# Patient Record
Sex: Male | Born: 1980 | Race: White | Hispanic: No | Marital: Single | Smoking: Current every day smoker
Health system: Southern US, Community
[De-identification: ages and names within clinical notes are randomized; demographics above are authoritative.]

## PROBLEM LIST (undated history)

## (undated) DIAGNOSIS — I10 Essential (primary) hypertension: Secondary | ICD-10-CM

## (undated) DIAGNOSIS — R079 Chest pain, unspecified: Secondary | ICD-10-CM

## (undated) DIAGNOSIS — R002 Palpitations: Secondary | ICD-10-CM

## (undated) HISTORY — PX: TONSILLECTOMY: SUR1361

## (undated) HISTORY — DX: Palpitations: R00.2

## (undated) HISTORY — PX: KNEE SURGERY: SHX244

## (undated) HISTORY — DX: Chest pain, unspecified: R07.9

---

## 2020-12-28 ENCOUNTER — Ambulatory Visit
Admission: EM | Admit: 2020-12-28 | Discharge: 2020-12-28 | Disposition: A | Discharge status: Home / Self Care | Payer: BC Managed Care – PPO | Attending: Family Medicine | Admitting: Family Medicine

## 2020-12-28 ENCOUNTER — Other Ambulatory Visit: Payer: Self-pay

## 2020-12-28 ENCOUNTER — Ambulatory Visit (INDEPENDENT_AMBULATORY_CARE_PROVIDER_SITE_OTHER): Payer: BC Managed Care – PPO

## 2020-12-28 ENCOUNTER — Encounter: Payer: Self-pay | Admitting: Emergency Medicine

## 2020-12-28 DIAGNOSIS — R0781 Pleurodynia: Secondary | ICD-10-CM | POA: Diagnosis not present

## 2020-12-28 DIAGNOSIS — S2232XA Fracture of one rib, left side, initial encounter for closed fracture: Secondary | ICD-10-CM

## 2020-12-28 DIAGNOSIS — R030 Elevated blood-pressure reading, without diagnosis of hypertension: Secondary | ICD-10-CM

## 2020-12-28 DIAGNOSIS — W19XXXA Unspecified fall, initial encounter: Secondary | ICD-10-CM | POA: Diagnosis not present

## 2020-12-28 NOTE — ED Triage Notes (Signed)
Pt is present with left side rib pain. Pt states that he fell in the shower a couple weeks ago. Pt states that when he is up and moving he doesn't feel much pain just discomfort only when he is laying down is when he feels left side pain. Pt states that when he does take a deep breath he feels discomfort. Denies any chest pain.

## 2020-12-28 NOTE — ED Provider Notes (Signed)
[facility]    CSN: [id number] Arrival date & time: 12/28/20  1219      History   Chief Complaint Chief Complaint  Patient presents with  . Fall  . Flank Pain    HPI Alan Phelps is a 40 y.o. male.   HPI  Patient presents today with left flank pain.  Patient sustained a fall approximately 2 weeks ago landing on his left side.  He reports since that time he has experienced pain with lying on the left side and also with deep breathing or sneezing.  He is concerned for fracture involving the left rib.  Unrelated patient has elevated blood pressure and reports he is aware that he likely has hypertension.  He does not want to start any medication as he is currently in a progress of stopping smoking along with try to improve lifestyle through exercise.  Endorses a family history both mom and dad have hypertension and one parent also has atrial fibrillation.  Patient is asymptomatic of headache, shortness of breath or chest pain.   History reviewed. No pertinent past medical history.  There are no problems to display for this patient.   Home Medications    Prior to Admission medications   Not on File    Family History No family history on file.  Social History     Allergies   Patient has no known allergies.   Review of Systems Review of Systems Pertinent negatives listed in HPI   Physical Exam Triage Vital Signs ED Triage Vitals  Enc Vitals Group     BP 12/28/20 1253 (!) 165/105     Pulse Rate 12/28/20 1253 67     Resp 12/28/20 1253 18     Temp 12/28/20 1253 97.9 F (36.6 C)     Temp Source 12/28/20 1253 Oral     SpO2 12/28/20 1253 95 %     Weight --      Height --      Head Circumference --      Peak Flow --      Pain Score 12/28/20 1252 7     Pain Loc --      Pain Edu? --      Excl. in GC? --    No data found.  Updated Vital Signs BP (!) 165/105 (BP Location: Left Arm)   Pulse 67   Temp 97.9 F (36.6 C) (Oral)   Resp 18    SpO2 95%   Visual Acuity Right Eye Distance:   Left Eye Distance:   Bilateral Distance:    Right Eye Near:   Left Eye Near:    Bilateral Near:     Physical Exam General appearance: alert, well developed, well nourished, cooperative and in no distress Head: Normocephalic, without obvious abnormality, atraumatic Respiratory: Respirations even and unlabored, normal respiratory rate Heart: rate and rhythm normal. No gallop or murmurs noted on exam  Abdomen: BS +, no distention, no rebound tenderness, or no mass Extremities: Left flank tenderness no obvious deformity Skin: Skin color, texture, turgor normal. No rashes seen  Psych: Appropriate mood and affect. Neurologic: Mental status: Alert, oriented to person, place, and time, thought content appropriate.  UC Treatments / Results  Labs (all labs ordered are listed, but only abnormal results are displayed) Labs Reviewed - No data to display  EKG   Radiology No results found.  Procedures Procedures (including critical care time)  Medications Ordered in UC Medications - No data to display  Initial  Impression / Assessment and Plan / UC Course  I have reviewed the triage vital signs and the nursing notes.  Pertinent labs & imaging results that were available during my care of the patient were reviewed by me and considered in my medical decision making (see chart for details).     Left rib fracture nondisplaced.  Management with Tylenol and ibuprofen at home.  As long as is warm compresses.  Patient also advised he can wear a back brace and/or use kinetic tape for splinting to reduce pain.  Patient advised to resume normal activity however avoid any strenuous activity that precipitates pain as this is a sign of over activity. Discussed elevated blood pressure patient declined any treatment.  Advised of red flags of uncontrolled hypertension and did advise he should follow-up with someone in 3 months if blood pressure remains  elevated. Final Clinical Impressions(s) / UC Diagnoses   Final diagnoses:  Closed fracture of one rib of left side, initial encounter  Elevated blood pressure reading in office without diagnosis of hypertension   Discharge Instructions   None    ED Prescriptions    None     PDMP not reviewed this encounter.   Bing Neighbors, [place] 12/28/20 [phone]

## 2021-05-05 DIAGNOSIS — R079 Chest pain, unspecified: Secondary | ICD-10-CM | POA: Diagnosis not present

## 2021-05-05 DIAGNOSIS — R002 Palpitations: Secondary | ICD-10-CM | POA: Diagnosis not present

## 2021-05-05 DIAGNOSIS — F17200 Nicotine dependence, unspecified, uncomplicated: Secondary | ICD-10-CM | POA: Diagnosis not present

## 2021-05-05 DIAGNOSIS — R0602 Shortness of breath: Secondary | ICD-10-CM | POA: Diagnosis not present

## 2021-05-05 DIAGNOSIS — I10 Essential (primary) hypertension: Secondary | ICD-10-CM | POA: Diagnosis not present

## 2021-05-05 DIAGNOSIS — F17210 Nicotine dependence, cigarettes, uncomplicated: Secondary | ICD-10-CM | POA: Diagnosis not present

## 2021-05-06 ENCOUNTER — Telehealth: Payer: Self-pay | Admitting: *Deleted

## 2021-05-06 NOTE — Telephone Encounter (Signed)
Patient is calling to schedule a hospital follow up appointment. He states he was seen in [facility] and was referred for follow up, but I'm unable to see that. Can someone return this patient's call?

## 2021-06-24 ENCOUNTER — Ambulatory Visit (INDEPENDENT_AMBULATORY_CARE_PROVIDER_SITE_OTHER): Payer: BC Managed Care – PPO

## 2021-06-24 ENCOUNTER — Other Ambulatory Visit: Payer: Self-pay

## 2021-06-24 ENCOUNTER — Ambulatory Visit: Payer: BC Managed Care – PPO | Admitting: Cardiology

## 2021-06-24 VITALS — BP 146/100 | HR 82 | Ht 75.0 in | Wt 253.4 lb

## 2021-06-24 DIAGNOSIS — I10 Essential (primary) hypertension: Secondary | ICD-10-CM | POA: Diagnosis not present

## 2021-06-24 DIAGNOSIS — Z13220 Encounter for screening for lipoid disorders: Secondary | ICD-10-CM

## 2021-06-24 DIAGNOSIS — R0789 Other chest pain: Secondary | ICD-10-CM

## 2021-06-24 DIAGNOSIS — R0600 Dyspnea, unspecified: Secondary | ICD-10-CM

## 2021-06-24 DIAGNOSIS — R002 Palpitations: Secondary | ICD-10-CM

## 2021-06-24 DIAGNOSIS — R0609 Other forms of dyspnea: Secondary | ICD-10-CM

## 2021-06-24 DIAGNOSIS — R079 Chest pain, unspecified: Secondary | ICD-10-CM | POA: Diagnosis not present

## 2021-06-24 MED ORDER — METOPROLOL TARTRATE 100 MG PO TABS
ORAL_TABLET | ORAL | 0 refills | Status: DC
Start: 2021-06-24 — End: 2021-08-10

## 2021-06-24 NOTE — Patient Instructions (Addendum)
Medication Instructions:  Your physician recommends that you continue on your current medications as directed. Please refer to the Current Medication list given to you today.  Please take your blood pressure daily and send a MyChart message in 1 week.  *If you need a refill on your cardiac medications before your next appointment, please call your pharmacy*   Lab Work: Your physician recommends that you return for lab work in:  TODAY: BMET, Mag, Lipids If you have labs (blood work) drawn today and your tests are completely normal, you will receive your results only by: MyChart Message (if you have MyChart) OR A paper copy in the mail If you have any lab test that is abnormal or we need to change your treatment, we will call you to review the results.   Testing/Procedures: Your physician has requested that you have an echocardiogram. Echocardiography is a painless test that uses sound waves to create images of your heart. It provides your doctor with information about the size and shape of your heart and how well your heart's chambers and valves are working. This procedure takes approximately one hour. There are no restrictions for this procedure.  A zio monitor was ordered today. It will remain on for 14 days. You will then return monitor and event diary in provided box. It takes 1-2 weeks for report to be downloaded and returned to [place]. We will call you with the results. If monitor falls off or has orange flashing light, please call Zio for further instructions.   ZIO  WHY IS MY DOCTOR PRESCRIBING ZIO? The Zio system is proven and trusted by physicians to detect and diagnose irregular heart rhythms -- and has been prescribed to hundreds of thousands of patients.  The FDA has cleared the Zio system to monitor for many different kinds of irregular heart rhythms. In a study, physicians were able to reach a diagnosis 90% of the time with the Zio system1.  You can wear the Zio monitor -- a  small, discreet, comfortable patch -- during your normal day-to-day activity, including while you sleep, shower, and exercise, while it records every single heartbeat for analysis.  1Barrett, P., et al. Comparison of 24 Hour Holter Monitoring Versus 14 Day Novel Adhesive Patch Electrocardiographic Monitoring. American Journal of Medicine, 2014.  ZIO VS. HOLTER MONITORING The Zio monitor can be comfortably worn for up to 14 days. Holter monitors can be worn for 24 to 48 hours, limiting the time to record any irregular heart rhythms you may have. Zio is able to capture data for the 51% of patients who have their first symptom-triggered arrhythmia after 48 hours.1  LIVE WITHOUT RESTRICTIONS The Zio ambulatory cardiac monitor is a small, unobtrusive, and water-resistant patch--you might even forget you're wearing it. The Zio monitor records and stores every beat of your heart, whether you're sleeping, working out, or showering. Remove on: Oct 7th 2022    Your cardiac CT will be scheduled at one of the below locations:   [facility] [address] [place], [place] [address] [phone]   If scheduled at [facility], please arrive at the [facility] main entrance (entrance A) of [facility] 30 minutes prior to test start time. Proceed to the [facility] Radiology Department (first floor) to check-in and test prep.  Please follow these instructions carefully (unless otherwise directed):   On the Night Before the Test: Be sure to Drink plenty of water. Do not consume any caffeinated/decaffeinated beverages or chocolate  12 hours prior to your test. Do not take any antihistamines 12 hours prior to your test.   On the Day of the Test: Drink plenty of water until 1 hour prior to the test. Do not eat any food 4 hours prior to the test. You may take your regular medications prior to the test.  Take metoprolol (Lopressor) two hours prior to test. HOLD  Hydrochlorothiazide morning of the test.  After the Test: Drink plenty of water. After receiving IV contrast, you may experience a mild flushed feeling. This is normal. On occasion, you may experience a mild rash up to 24 hours after the test. This is not dangerous. If this occurs, you can take Benadryl 25 mg and increase your fluid intake. If you experience trouble breathing, this can be serious. If it is severe call 911 IMMEDIATELY. If it is mild, please call our office. If you take any of these medications: Glipizide/Metformin, Avandament, Glucavance, please do not take 48 hours after completing test unless otherwise instructed.  Please allow 2-4 weeks for scheduling of routine cardiac CTs. Some insurance companies require a pre-authorization which may delay scheduling of this test.   For non-scheduling related questions, please contact the cardiac imaging nurse navigator should you have any questions/concerns: [facility] [place], Cardiac Imaging Nurse Navigator Larey Brick, Cardiac Imaging Nurse Navigator [facility] Direct Office Dial: [phone]   For scheduling needs, including cancellations and rescheduling, please call [place], [phone].   Follow-Up: At [facility], you and your health needs are our priority.  As part of our continuing mission to provide you with exceptional heart care, we have created designated Provider Care Teams.  These Care Teams include your primary Cardiologist (physician) and Advanced Practice Providers (APPs -  Physician Assistants and Nurse Practitioners) who all work together to provide you with the care you need, when you need it.  We recommend signing up for the patient portal called "MyChart".  Sign up information is provided on this After Visit Summary.  MyChart is used to connect with patients for Virtual Visits (Telemedicine).  Patients are able to view lab/test results, encounter notes, upcoming appointments, etc.   Non-urgent messages can be sent to your provider as well.   To learn more about what you can do with MyChart, go to [email or web address].    Your next appointment:   6 week(s)  The format for your next appointment:   Virtual Visit   Provider:   Thomasene Ripple, DO [address], [place], [place] [address]    Other Instructions Echocardiogram An echocardiogram is a test that uses sound waves (ultrasound) to produce images of the heart. Images from an echocardiogram can provide important information about: Heart size and shape. The size and thickness and movement of your heart's walls. Heart muscle function and strength. Heart valve function or if you have stenosis. Stenosis is when the heart valves are too narrow. If blood is flowing backward through the heart valves (regurgitation). A tumor or infectious growth around the heart valves. Areas of heart muscle that are not working well because of poor blood flow or injury from a heart attack. Aneurysm detection. An aneurysm is a weak or damaged part of an artery wall. The wall bulges out from the normal force of blood pumping through the body. Tell a health care provider about: Any allergies you have. All medicines you are taking, including vitamins, herbs, eye drops, creams, and over-the-counter medicines. Any blood disorders you have. Any surgeries you have  had. Any medical conditions you have. Whether you are pregnant or may be pregnant. What are the risks? Generally, this is a safe test. However, problems may occur, including an allergic reaction to dye (contrast) that may be used during the test. What happens before the test? No specific preparation is needed. You may eat and drink normally. What happens during the test?  You will take off your clothes from the waist up and put on a hospital gown. Electrodes or electrocardiogram (ECG)patches may be placed on your chest. The electrodes or patches are then connected to a  device that monitors your heart rate and rhythm. You will lie down on a table for an ultrasound exam. A gel will be applied to your chest to help sound waves pass through your skin. A handheld device, called a transducer, will be pressed against your chest and moved over your heart. The transducer produces sound waves that travel to your heart and bounce back (or "echo" back) to the transducer. These sound waves will be captured in real-time and changed into images of your heart that can be viewed on a video monitor. The images will be recorded on a computer and reviewed by your health care provider. You may be asked to change positions or hold your breath for a short time. This makes it easier to get different views or better views of your heart. In some cases, you may receive contrast through an IV in one of your veins. This can improve the quality of the pictures from your heart. The procedure may vary among health care providers and hospitals. What can I expect after the test? You may return to your normal, everyday life, including diet, activities, and medicines, unless your health care provider tells you not to do that. Follow these instructions at home: It is up to you to get the results of your test. Ask your health care provider, or the department that is doing the test, when your results will be ready. Keep all follow-up visits. This is important. Summary An echocardiogram is a test that uses sound waves (ultrasound) to produce images of the heart. Images from an echocardiogram can provide important information about the size and shape of your heart, heart muscle function, heart valve function, and other possible heart problems. You do not need to do anything to prepare before this test. You may eat and drink normally. After the echocardiogram is completed, you may return to your normal, everyday life, unless your health care provider tells you not to do that. This information is not  intended to replace advice given to you by your health care provider. Make sure you discuss any questions you have with your health care provider. Document Revised: 05/11/2020 Document Reviewed: 05/11/2020 Elsevier Patient Education  2022 [employer].

## 2021-06-24 NOTE — Progress Notes (Signed)
Cardiology Office Note:    Date:  06/24/2021   ID:  Alan Phelps, DOB 01-Jan-1981, MRN [id number]  PCP:  Kaleen Mask, MD  Cardiologist:  Thomasene Ripple, DO  Electrophysiologist:  None   Referring MD: Kaleen Mask, *   'I was recently diagnosed with hypertension"  History of Present Illness:    Alan Phelps is a 40 y.o. male with a hx of recently diagnosed hypertension however the patient believes that he has had hypertension for many years because to many physicals he has had an elevated blood pressure, hyperlipidemia he had lipid profile done many years ago which was elevated, obesity.  The patient is here today with his wife.  He has multiple complaints.  He has had some chest pain, shortness of breath as well as intermittent palpitations. He described the chest pain as a left-sided dullness which is mostly sometimes tightness and pressure-like sensation.  Is lower left side it comes off and on.  He is not sure how long it lasts.  But is intermittent.  Nothing makes it better or worse and resolves on its own.  He is also have recently been experiencing this on rest.  For the patient the chest tightness is to be an issue.  Intentional shortness of breath is on exertion.  He gets winded mostly on the incline.  He is good on flat surfaces.  In addition there has been palpitations he described as a fluttering in his initially was intense but now it is not as frequent but is still occurring.  He denies any lightheadedness or dizziness.  He is particularly concerned given his family history.   Past Medical History:  Diagnosis Date   Intermittent chest pain    Palpitations     Past Surgical History:  Procedure Laterality Date   KNEE SURGERY     TONSILLECTOMY      Current Medications: Current Meds  Medication Sig   amLODipine (NORVASC) 10 MG tablet Take 10 mg by mouth daily.   hydrochlorothiazide (HYDRODIURIL) 25 MG tablet Take 25 mg by mouth daily.   metoprolol  tartrate (LOPRESSOR) 100 MG tablet Take 2 hours prior to CT     Allergies:   Patient has no known allergies.   Social History   Socioeconomic History   Marital status: Single    Spouse name: Not on file   Number of children: Not on file   Years of education: Not on file   Highest education level: Not on file  Occupational History   Not on file  Tobacco Use   Smoking status: Every Day    Types: Cigarettes   Smokeless tobacco: Never  Substance and Sexual Activity   Alcohol use: Yes    Comment: ocass   Drug use: Never   Sexual activity: Not on file  Other Topics Concern   Not on file  Social History Narrative   Not on file   Social Determinants of Health   Financial Resource Strain: Not on file  Food Insecurity: Not on file  Transportation Needs: Not on file  Physical Activity: Not on file  Stress: Not on file  Social Connections: Not on file     Family History: The patient's family history includes Hypertension in his father.  ROS:   Review of Systems  Constitution: Negative for decreased appetite, fever and weight gain.  HENT: Negative for congestion, ear discharge, hoarse voice and sore throat.   Eyes: Negative for discharge, redness, vision loss in right eye and  visual halos.  Cardiovascular: Negative for chest pain, dyspnea on exertion, leg swelling, orthopnea and palpitations.  Respiratory: Negative for cough, hemoptysis, shortness of breath and snoring.   Endocrine: Negative for heat intolerance and polyphagia.  Hematologic/Lymphatic: Negative for bleeding problem. Does not bruise/bleed easily.  Skin: Negative for flushing, nail changes, rash and suspicious lesions.  Musculoskeletal: Negative for arthritis, joint pain, muscle cramps, myalgias, neck pain and stiffness.  Gastrointestinal: Negative for abdominal pain, bowel incontinence, diarrhea and excessive appetite.  Genitourinary: Negative for decreased libido, genital sores and incomplete emptying.   Neurological: Negative for brief paralysis, focal weakness, headaches and loss of balance.  Psychiatric/Behavioral: Negative for altered mental status, depression and suicidal ideas.  Allergic/Immunologic: Negative for HIV exposure and persistent infections.    EKGs/Labs/Other Studies Reviewed:    The following studies were reviewed today:   EKG:  The ekg ordered today demonstrates   Recent Labs: No results found for requested labs within last 8760 hours.  Recent Lipid Panel No results found for: CHOL, TRIG, HDL, CHOLHDL, VLDL, LDLCALC, LDLDIRECT  Physical Exam:    VS:  BP (!) 146/100   Pulse 82   Ht 6\' 3"  (1.905 m)   Wt 253 lb 6.4 oz (114.9 kg)   SpO2 96%   BMI 31.67 kg/m     Wt Readings from Last 3 Encounters:  06/24/21 253 lb 6.4 oz (114.9 kg)     GEN: Well nourished, well developed in no acute distress HEENT: Normal NECK: No JVD; No carotid bruits LYMPHATICS: No lymphadenopathy CARDIAC: S1S2 noted,RRR, no murmurs, rubs, gallops RESPIRATORY:  Clear to auscultation without rales, wheezing or rhonchi  ABDOMEN: Soft, non-tender, non-distended, +bowel sounds, no guarding. EXTREMITIES: No edema, No cyanosis, no clubbing MUSCULOSKELETAL:  No deformity  SKIN: Warm and dry NEUROLOGIC:  Alert and oriented x 3, non-focal PSYCHIATRIC:  Normal affect, good insight  ASSESSMENT:    1. Palpitations   2. DOE (dyspnea on exertion)   3. Chest pain of uncertain etiology   4. Hypertension, unspecified type   5. Other chest pain   6. Screening for hyperlipidemia    PLAN:     He is hypertensive in the office today.  But he tells me he has not taken his medication he takes amlodipine 10 mg daily and hydrochlorothiazide 25 mg a day.  Personal 06/26/21 believes this will make a difference as I do believe he will need a third agent.  He will take his blood pressure for the next week and send me this information should this be still elevated I plan to add valsartan to his  regimen.  Smoking cessation advised.  The symptoms chest pain is concerning, this patient does have intermediate risk for coronary artery disease and at this time I would like to pursue an ischemic evaluation in this patient.  Shared decision a coronary CTA at this time is appropriate.  I have discussed with the patient about the testing.  The patient has no IV contrast allergy and is agreeable to proceed with this test.  He has had suspected longstanding hypertension, only to get an echocardiogram to assess for any diastolic dysfunction or any significant LVH.  I would like to rule out a cardiovascular etiology of this palpitation, therefore at this time I would like to placed a zio patch for  14  days. In additon a transthoracic echocardiogram will be ordered to assess LV/RV function and any structural abnormalities. Once these testing have been performed amd reviewed further reccomendations will be made.  For now, I do reccomend that the patient goes to the nearest ED if  symptoms recur.  The patient is in agreement with the above plan. The patient left the office in stable condition.  The patient will follow up in   Medication Adjustments/Labs and Tests Ordered: Current medicines are reviewed at length with the patient today.  Concerns regarding medicines are outlined above.  Orders Placed This Encounter  Procedures   CT CORONARY MORPH W/CTA COR W/SCORE W/CA W/CM &/OR WO/CM   Basic Metabolic Panel (BMET)   Magnesium   Lipid panel   LONG TERM MONITOR (3-14 DAYS)   EKG 12-Lead   ECHOCARDIOGRAM COMPLETE   Meds ordered this encounter  Medications   metoprolol tartrate (LOPRESSOR) 100 MG tablet    Sig: Take 2 hours prior to CT    Dispense:  1 tablet    Refill:  0    Patient Instructions  Medication Instructions:  Your physician recommends that you continue on your current medications as directed. Please refer to the Current Medication list given to you today.  Please take your blood  pressure daily and send a MyChart message in 1 week.  *If you need a refill on your cardiac medications before your next appointment, please call your pharmacy*   Lab Work: Your physician recommends that you return for lab work in:  TODAY: BMET, Mag, Lipids If you have labs (blood work) drawn today and your tests are completely normal, you will receive your results only by: MyChart Message (if you have MyChart) OR A paper copy in the mail If you have any lab test that is abnormal or we need to change your treatment, we will call you to review the results.   Testing/Procedures: Your physician has requested that you have an echocardiogram. Echocardiography is a painless test that uses sound waves to create images of your heart. It provides your doctor with information about the size and shape of your heart and how well your heart's chambers and valves are working. This procedure takes approximately one hour. There are no restrictions for this procedure.  A zio monitor was ordered today. It will remain on for 14 days. You will then return monitor and event diary in provided box. It takes 1-2 weeks for report to be downloaded and returned to [place]. We will call you with the results. If monitor falls off or has orange flashing light, please call Zio for further instructions.   ZIO  WHY IS MY DOCTOR PRESCRIBING ZIO? The Zio system is proven and trusted by physicians to detect and diagnose irregular heart rhythms -- and has been prescribed to hundreds of thousands of patients.  The FDA has cleared the Zio system to monitor for many different kinds of irregular heart rhythms. In a study, physicians were able to reach a diagnosis 90% of the time with the Zio system1.  You can wear the Zio monitor -- a small, discreet, comfortable patch -- during your normal day-to-day activity, including while you sleep, shower, and exercise, while it records every single heartbeat for analysis.  1Barrett, P., et al.  Comparison of 24 Hour Holter Monitoring Versus 14 Day Novel Adhesive Patch Electrocardiographic Monitoring. American Journal of Medicine, 2014.  ZIO VS. HOLTER MONITORING The Zio monitor can be comfortably worn for up to 14 days. Holter monitors can be worn for 24 to 48 hours, limiting the time to record any irregular heart rhythms you may have. Zio is able to capture data for the 51% of  patients who have their first symptom-triggered arrhythmia after 48 hours.1  LIVE WITHOUT RESTRICTIONS The Zio ambulatory cardiac monitor is a small, unobtrusive, and water-resistant patch--you might even forget you're wearing it. The Zio monitor records and stores every beat of your heart, whether you're sleeping, working out, or showering. Remove on: Oct 7th 2022    Your cardiac CT will be scheduled at one of the below locations:   [facility] [address] [place], [place] [address] [phone]   If scheduled at [facility], please arrive at the [facility] main entrance (entrance A) of [facility] 30 minutes prior to test start time. Proceed to the [facility] Radiology Department (first floor) to check-in and test prep.  Please follow these instructions carefully (unless otherwise directed):   On the Night Before the Test: Be sure to Drink plenty of water. Do not consume any caffeinated/decaffeinated beverages or chocolate 12 hours prior to your test. Do not take any antihistamines 12 hours prior to your test.   On the Day of the Test: Drink plenty of water until 1 hour prior to the test. Do not eat any food 4 hours prior to the test. You may take your regular medications prior to the test.  Take metoprolol (Lopressor) two hours prior to test. HOLD Hydrochlorothiazide morning of the test.  After the Test: Drink plenty of water. After receiving IV contrast, you may experience a mild flushed feeling. This is normal. On occasion, you may experience a mild rash  up to 24 hours after the test. This is not dangerous. If this occurs, you can take Benadryl 25 mg and increase your fluid intake. If you experience trouble breathing, this can be serious. If it is severe call 911 IMMEDIATELY. If it is mild, please call our office. If you take any of these medications: Glipizide/Metformin, Avandament, Glucavance, please do not take 48 hours after completing test unless otherwise instructed.  Please allow 2-4 weeks for scheduling of routine cardiac CTs. Some insurance companies require a pre-authorization which may delay scheduling of this test.   For non-scheduling related questions, please contact the cardiac imaging nurse navigator should you have any questions/concerns: Rockwell Alexandria, Cardiac Imaging Nurse Navigator Larey Brick, Cardiac Imaging Nurse Navigator [facility] Direct Office Dial: [phone]   For scheduling needs, including cancellations and rescheduling, please call [place], [phone].   Follow-Up: At [facility], you and your health needs are our priority.  As part of our continuing mission to provide you with exceptional heart care, we have created designated Provider Care Teams.  These Care Teams include your primary Cardiologist (physician) and Advanced Practice Providers (APPs -  Physician Assistants and Nurse Practitioners) who all work together to provide you with the care you need, when you need it.  We recommend signing up for the patient portal called "MyChart".  Sign up information is provided on this After Visit Summary.  MyChart is used to connect with patients for Virtual Visits (Telemedicine).  Patients are able to view lab/test results, encounter notes, upcoming appointments, etc.  Non-urgent messages can be sent to your provider as well.   To learn more about what you can do with MyChart, go to [email or web address].    Your next appointment:   6 week(s)  The format for your next  appointment:   Virtual Visit   Provider:   Thomasene Ripple, DO [address], [place], [place] [address]    Other Instructions Echocardiogram An echocardiogram  is a test that uses sound waves (ultrasound) to produce images of the heart. Images from an echocardiogram can provide important information about: Heart size and shape. The size and thickness and movement of your heart's walls. Heart muscle function and strength. Heart valve function or if you have stenosis. Stenosis is when the heart valves are too narrow. If blood is flowing backward through the heart valves (regurgitation). A tumor or infectious growth around the heart valves. Areas of heart muscle that are not working well because of poor blood flow or injury from a heart attack. Aneurysm detection. An aneurysm is a weak or damaged part of an artery wall. The wall bulges out from the normal force of blood pumping through the body. Tell a health care provider about: Any allergies you have. All medicines you are taking, including vitamins, herbs, eye drops, creams, and over-the-counter medicines. Any blood disorders you have. Any surgeries you have had. Any medical conditions you have. Whether you are pregnant or may be pregnant. What are the risks? Generally, this is a safe test. However, problems may occur, including an allergic reaction to dye (contrast) that may be used during the test. What happens before the test? No specific preparation is needed. You may eat and drink normally. What happens during the test?  You will take off your clothes from the waist up and put on a hospital gown. Electrodes or electrocardiogram (ECG)patches may be placed on your chest. The electrodes or patches are then connected to a device that monitors your heart rate and rhythm. You will lie down on a table for an ultrasound exam. A gel will be applied to your chest to help sound waves pass through your skin. A handheld device, called a  transducer, will be pressed against your chest and moved over your heart. The transducer produces sound waves that travel to your heart and bounce back (or "echo" back) to the transducer. These sound waves will be captured in real-time and changed into images of your heart that can be viewed on a video monitor. The images will be recorded on a computer and reviewed by your health care provider. You may be asked to change positions or hold your breath for a short time. This makes it easier to get different views or better views of your heart. In some cases, you may receive contrast through an IV in one of your veins. This can improve the quality of the pictures from your heart. The procedure may vary among health care providers and hospitals. What can I expect after the test? You may return to your normal, everyday life, including diet, activities, and medicines, unless your health care provider tells you not to do that. Follow these instructions at home: It is up to you to get the results of your test. Ask your health care provider, or the department that is doing the test, when your results will be ready. Keep all follow-up visits. This is important. Summary An echocardiogram is a test that uses sound waves (ultrasound) to produce images of the heart. Images from an echocardiogram can provide important information about the size and shape of your heart, heart muscle function, heart valve function, and other possible heart problems. You do not need to do anything to prepare before this test. You may eat and drink normally. After the echocardiogram is completed, you may return to your normal, everyday life, unless your health care provider tells you not to do that. This information is not intended to replace  advice given to you by your health care provider. Make sure you discuss any questions you have with your health care provider. Document Revised: 05/11/2020 Document Reviewed: 05/11/2020 Elsevier  Patient Education  2022 Elsevier Inc.     Adopting a Healthy Lifestyle.  Know what a healthy weight is for you (roughly BMI <25) and aim to maintain this   Aim for 7+ servings of fruits and vegetables daily   65-80+ fluid ounces of water or unsweet tea for healthy kidneys   Limit to max 1 drink of alcohol per day; avoid smoking/tobacco   Limit animal fats in diet for cholesterol and heart health - choose grass fed whenever available   Avoid highly processed foods, and foods high in saturated/trans fats   Aim for low stress - take time to unwind and care for your mental health   Aim for 150 min of moderate intensity exercise weekly for heart health, and weights twice weekly for bone health   Aim for 7-9 hours of sleep daily   When it comes to diets, agreement about the perfect plan isnt easy to find, even among the experts. Experts at the [facility] [employer] [facility] [employer] developed an idea known as the Healthy Eating Plate. Just imagine a plate divided into logical, healthy portions.   The emphasis is on diet quality:   Load up on vegetables and fruits - one-half of your plate: Aim for color and variety, and remember that potatoes dont count.   Go for whole grains - one-quarter of your plate: Whole wheat, barley, wheat berries, quinoa, oats, brown rice, and foods made with them. If you want pasta, go with whole wheat pasta.   Protein power - one-quarter of your plate: Fish, chicken, beans, and nuts are all healthy, versatile protein sources. Limit red meat.   The diet, however, does go beyond the plate, offering a few other suggestions.   Use healthy plant oils, such as olive, canola, soy, corn, sunflower and peanut. Check the labels, and avoid partially hydrogenated oil, which have unhealthy trans fats.   If youre thirsty, drink water. Coffee and tea are good in moderation, but skip sugary drinks and limit milk and dairy products to one or two daily servings.   The type of  carbohydrate in the diet is more important than the amount. Some sources of carbohydrates, such as vegetables, fruits, whole grains, and beans-are healthier than others.   Finally, stay active  Signed, Thomasene Ripple, DO  06/24/2021 1:02 PM    [facility]

## 2021-06-25 LAB — BASIC METABOLIC PANEL
BUN/Creatinine Ratio: 22 — ABNORMAL HIGH (ref 9–20)
BUN: 18 mg/dL (ref 6–24)
CO2: 26 mmol/L (ref 20–29)
Calcium: 9.7 mg/dL (ref 8.7–10.2)
Chloride: 99 mmol/L (ref 96–106)
Creatinine, Ser: 0.83 mg/dL (ref 0.76–1.27)
Glucose: 99 mg/dL (ref 65–99)
Potassium: 4.2 mmol/L (ref 3.5–5.2)
Sodium: 137 mmol/L (ref 134–144)
eGFR: 113 mL/min/{1.73_m2} (ref >59)

## 2021-06-25 LAB — LIPID PANEL
Chol/HDL Ratio: 5.7 ratio — ABNORMAL HIGH (ref 0.0–5.0)
Cholesterol, Total: 194 mg/dL (ref 100–199)
HDL: 34 mg/dL — ABNORMAL LOW (ref >39)
LDL Chol Calc (NIH): 144 mg/dL — ABNORMAL HIGH (ref 0–99)
Triglycerides: 87 mg/dL (ref 0–149)
VLDL Cholesterol Cal: 16 mg/dL (ref 5–40)

## 2021-06-25 LAB — MAGNESIUM: Magnesium: 2.3 mg/dL (ref 1.6–2.3)

## 2021-06-27 ENCOUNTER — Other Ambulatory Visit: Payer: Self-pay

## 2021-06-27 ENCOUNTER — Telehealth (HOSPITAL_COMMUNITY): Payer: Self-pay | Admitting: Emergency Medicine

## 2021-06-27 MED ORDER — ROSUVASTATIN CALCIUM 5 MG PO TABS: 5.0000 mg | ORAL_TABLET | Freq: Every day | ORAL | 3 refills | Status: AC

## 2021-06-27 NOTE — Telephone Encounter (Signed)
Reaching out to patient to offer assistance regarding upcoming cardiac imaging study; pt verbalizes understanding of appt date/time, parking situation and where to check in, pre-test NPO status and medications ordered, and verified current allergies; name and call back number provided for further questions should they arise Quantez Schnyder RN Navigator Cardiac Imaging [facility] [phone] office [phone] cell 

## 2021-06-29 ENCOUNTER — Encounter: Payer: BC Managed Care – PPO | Admitting: *Deleted

## 2021-06-29 ENCOUNTER — Encounter (HOSPITAL_COMMUNITY): Payer: Self-pay

## 2021-06-29 ENCOUNTER — Ambulatory Visit (HOSPITAL_COMMUNITY)
Admission: RE | Admit: 2021-06-29 | Discharge: 2021-06-29 | Disposition: A | Discharge status: Home / Self Care | Payer: BC Managed Care – PPO | Source: Ambulatory Visit | Attending: Cardiology | Admitting: Cardiology

## 2021-06-29 ENCOUNTER — Other Ambulatory Visit: Payer: Self-pay

## 2021-06-29 DIAGNOSIS — R0789 Other chest pain: Secondary | ICD-10-CM | POA: Diagnosis not present

## 2021-06-29 DIAGNOSIS — Z006 Encounter for examination for normal comparison and control in clinical research program: Secondary | ICD-10-CM

## 2021-06-29 MED ORDER — NITROGLYCERIN 0.4 MG SL SUBL
SUBLINGUAL_TABLET | SUBLINGUAL | Status: AC
  Filled 2021-06-29: qty 2

## 2021-06-29 MED ORDER — NITROGLYCERIN 0.4 MG SL SUBL
0.8000 mg | SUBLINGUAL_TABLET | Freq: Once | SUBLINGUAL | Status: AC
  Administered 2021-06-29: 0.8 mg via SUBLINGUAL

## 2021-06-29 MED ORDER — IOHEXOL 350 MG/ML SOLN
95.0000 mL | Freq: Once | INTRAVENOUS | Status: AC | PRN
  Administered 2021-06-29: 95 mL via INTRAVENOUS

## 2021-06-29 NOTE — Research (Signed)
IDENTIFY Informed Consent                  Subject Name:   Alan Phelps   Subject met inclusion and exclusion criteria.  The informed consent form, study requirements and expectations were reviewed with the subject and questions and concerns were addressed prior to the signing of the consent form.  The subject verbalized understanding of the trial requirements.  The subject agreed to participate in the IDENTIFY trial and signed the informed consent at 15:45 on 06-29-2021.  The informed consent was obtained prior to performance of any protocol-specific procedures for the subject.  A copy of the signed informed consent was given to the subject and a copy was placed in the subject's medical record.   [place] Jemal Miskell, Research Assistant  06/29/2021   15:45

## 2021-07-04 ENCOUNTER — Other Ambulatory Visit: Payer: Self-pay

## 2021-07-04 MED ORDER — CARVEDILOL 6.25 MG PO TABS: 6.2500 mg | ORAL_TABLET | Freq: Two times a day (BID) | ORAL | 3 refills | Status: DC

## 2021-07-08 ENCOUNTER — Other Ambulatory Visit: Payer: BC Managed Care – PPO

## 2021-07-13 ENCOUNTER — Other Ambulatory Visit: Payer: Self-pay

## 2021-07-13 MED ORDER — CARVEDILOL 12.5 MG PO TABS: 12.5000 mg | ORAL_TABLET | Freq: Two times a day (BID) | ORAL | 3 refills | Status: AC

## 2021-07-13 NOTE — Progress Notes (Signed)
Prescription sent to pharmacy.

## 2021-07-15 DIAGNOSIS — R002 Palpitations: Secondary | ICD-10-CM | POA: Diagnosis not present

## 2021-08-10 ENCOUNTER — Telehealth (INDEPENDENT_AMBULATORY_CARE_PROVIDER_SITE_OTHER): Payer: BC Managed Care – PPO | Admitting: Cardiology

## 2021-08-10 ENCOUNTER — Encounter: Payer: Self-pay | Admitting: Cardiology

## 2021-08-10 VITALS — BP 156/104 | HR 76 | Ht 75.0 in | Wt 250.0 lb

## 2021-08-10 DIAGNOSIS — I10 Essential (primary) hypertension: Secondary | ICD-10-CM

## 2021-08-10 DIAGNOSIS — R400 Somnolence: Secondary | ICD-10-CM

## 2021-08-10 DIAGNOSIS — R0683 Snoring: Secondary | ICD-10-CM

## 2021-08-10 DIAGNOSIS — R0602 Shortness of breath: Secondary | ICD-10-CM

## 2021-08-10 MED ORDER — VALSARTAN 80 MG PO TABS: 80.0000 mg | ORAL_TABLET | Freq: Every day | ORAL | 3 refills | Status: DC

## 2021-08-10 MED ORDER — HYDROCHLOROTHIAZIDE 25 MG PO TABS: 25.0000 mg | ORAL_TABLET | Freq: Every day | ORAL | 3 refills | Status: AC

## 2021-08-10 MED ORDER — AMLODIPINE BESYLATE 10 MG PO TABS: 10.0000 mg | ORAL_TABLET | Freq: Every day | ORAL | 3 refills | Status: AC

## 2021-08-10 NOTE — Patient Instructions (Addendum)
Medication Instructions:  Your physician has recommended you make the following change in your medication:  START: Valsartan 80 mg once daily  Please take your blood pressure daily and send the information in a MyChart message in 2 weeks. Please include blood pressure and heart rate.  *If you need a refill on your cardiac medications before your next appointment, please call your pharmacy*   Lab Work: None If you have labs (blood work) drawn today and your tests are completely normal, you will receive your results only by: MyChart Message (if you have MyChart) OR A paper copy in the mail If you have any lab test that is abnormal or we need to change your treatment, we will call you to review the results.   Testing/Procedures: Your physician has requested that you have an echocardiogram. Echocardiography is a painless test that uses sound waves to create images of your heart. It provides your doctor with information about the size and shape of your heart and how well your heart's chambers and valves are working. This procedure takes approximately one hour. There are no restrictions for this procedure.    Follow-Up: At [facility], you and your health needs are our priority.  As part of our continuing mission to provide you with exceptional heart care, we have created designated Provider Care Teams.  These Care Teams include your primary Cardiologist (physician) and Advanced Practice Providers (APPs -  Physician Assistants and Nurse Practitioners) who all work together to provide you with the care you need, when you need it.  We recommend signing up for the patient portal called "MyChart".  Sign up information is provided on this After Visit Summary.  MyChart is used to connect with patients for Virtual Visits (Telemedicine).  Patients are able to view lab/test results, encounter notes, upcoming appointments, etc.  Non-urgent messages can be sent to your provider as well.   To learn more  about what you can do with MyChart, go to [email or web address].    Your next appointment:   3 month(s) - blood pressure check  The format for your next appointment:   In Person    :1}    Other Instructions Echocardiogram An echocardiogram is a test that uses sound waves (ultrasound) to produce images of the heart. Images from an echocardiogram can provide important information about: Heart size and shape. The size and thickness and movement of your heart's walls. Heart muscle function and strength. Heart valve function or if you have stenosis. Stenosis is when the heart valves are too narrow. If blood is flowing backward through the heart valves (regurgitation). A tumor or infectious growth around the heart valves. Areas of heart muscle that are not working well because of poor blood flow or injury from a heart attack. Aneurysm detection. An aneurysm is a weak or damaged part of an artery wall. The wall bulges out from the normal force of blood pumping through the body. Tell a health care provider about: Any allergies you have. All medicines you are taking, including vitamins, herbs, eye drops, creams, and over-the-counter medicines. Any blood disorders you have. Any surgeries you have had. Any medical conditions you have. Whether you are pregnant or may be pregnant. What are the risks? Generally, this is a safe test. However, problems may occur, including an allergic reaction to dye (contrast) that may be used during the test. What happens before the test? No specific preparation is needed. You may eat and drink normally. What happens during the test?  You  will take off your clothes from the waist up and put on a hospital gown. Electrodes or electrocardiogram (ECG)patches may be placed on your chest. The electrodes or patches are then connected to a device that monitors your heart rate and rhythm. You will lie down on a table for an ultrasound exam. A gel will be applied to  your chest to help sound waves pass through your skin. A handheld device, called a transducer, will be pressed against your chest and moved over your heart. The transducer produces sound waves that travel to your heart and bounce back (or "echo" back) to the transducer. These sound waves will be captured in real-time and changed into images of your heart that can be viewed on a video monitor. The images will be recorded on a computer and reviewed by your health care provider. You may be asked to change positions or hold your breath for a short time. This makes it easier to get different views or better views of your heart. In some cases, you may receive contrast through an IV in one of your veins. This can improve the quality of the pictures from your heart. The procedure may vary among health care providers and hospitals. What can I expect after the test? You may return to your normal, everyday life, including diet, activities, and medicines, unless your health care provider tells you not to do that. Follow these instructions at home: It is up to you to get the results of your test. Ask your health care provider, or the department that is doing the test, when your results will be ready. Keep all follow-up visits. This is important. Summary An echocardiogram is a test that uses sound waves (ultrasound) to produce images of the heart. Images from an echocardiogram can provide important information about the size and shape of your heart, heart muscle function, heart valve function, and other possible heart problems. You do not need to do anything to prepare before this test. You may eat and drink normally. After the echocardiogram is completed, you may return to your normal, everyday life, unless your health care provider tells you not to do that. This information is not intended to replace advice given to you by your health care provider. Make sure you discuss any questions you have with your health  care provider. Document Revised: 06/01/2021 Document Reviewed: 05/11/2020 Elsevier Patient Education  2022 [employer].

## 2021-08-10 NOTE — Progress Notes (Signed)
Virtual Visit via Video Note   This visit type was conducted due to national recommendations for restrictions regarding the COVID-19 Pandemic (e.g. social distancing) in an effort to limit this patient's exposure and mitigate transmission in our community.  Due to his co-morbid illnesses, this patient is at least at moderate risk for complications without adequate follow up.  This format is felt to be most appropriate for this patient at this time.  All issues noted in this document were discussed and addressed.  A limited physical exam was performed with this format.  Please refer to the patient's chart for his consent to telehealth for [facility].      Date:  08/10/2021   ID:  Alan Phelps, DOB Jan 12, 1981, MRN [id number]  Patient Location: Home Provider Location: Office/Clinic  PCP:  Kaleen Mask, MD  Cardiologist:  Thomasene Ripple, DO  Electrophysiologist:  None   Evaluation Performed:  Follow-Up Visit Virtual Visit via Video  Note . I connected with the patient today by a   video enabled telemedicine application and verified that I am speaking with the correct person using two identifiers.   Chief Complaint: I am doing fine  History of Present Illness:    Alan Phelps is a 40 y.o. male with hypertension, obesity, hyperlipidemia I first saw the patient June 24, 2021 at that time.  He has been diagnosed with hypertension.  He also had been experiencing chest discomfort and shortness of breath.  At that visit he was also hypertensive I kept the patient is amlodipine 10 mg daily and hydrochlorothiazide 25 mg daily.  I recommended a coronary CT scan given his chest discomfort as well as an echocardiogram and he also had some palpitations I placed a monitor on the patient.  In the interim he was able to wear the monitor which showed paroxysmal SVT and his coronary CTA did not show any evidence of coronary artery disease.  Fortunately he was unable to get his  echocardiogram done.  He has since been started on Coreg 12.5 mg daily. Today he tells me his blood pressure is still elevated.  He still has shortness of breath on exertion.  He also tells me that he has been experiencing some daytime somnolence and his wife admits that he snores.  The patient does not have symptoms concerning for COVID-19 infection (fever, chills, cough, or new shortness of breath).    Past Medical History:  Diagnosis Date   Intermittent chest pain    Palpitations    Past Surgical History:  Procedure Laterality Date   KNEE SURGERY     TONSILLECTOMY       Current Meds  Medication Sig   amLODipine (NORVASC) 10 MG tablet Take 10 mg by mouth daily.   carvedilol (COREG) 12.5 MG tablet Take 1 tablet (12.5 mg total) by mouth 2 (two) times daily.   hydrochlorothiazide (HYDRODIURIL) 25 MG tablet Take 25 mg by mouth daily.   metoprolol tartrate (LOPRESSOR) 100 MG tablet Take 2 hours prior to CT   rosuvastatin (CRESTOR) 5 MG tablet Take 1 tablet (5 mg total) by mouth daily.     Allergies:   Patient has no known allergies.   Social History   Tobacco Use   Smoking status: Every Day    Types: Cigarettes   Smokeless tobacco: Never  Substance Use Topics   Alcohol use: Yes    Comment: ocass   Drug use: Never     Family Hx: The patient's family history includes Hypertension  in his father.  ROS:   Please see the history of present illness.     Review of Systems  Constitution: Negative for decreased appetite, fever and weight gain.  HENT: Negative for congestion, ear discharge, hoarse voice and sore throat.   Eyes: Negative for discharge, redness, vision loss in right eye and visual halos.  Cardiovascular: Report dyspnea exertion.  Negative for chest pain,leg swelling, orthopnea and palpitations.  Respiratory: Negative for cough, hemoptysis, shortness of breath and snoring.   Endocrine: Negative for heat intolerance and polyphagia.  Hematologic/Lymphatic:  Negative for bleeding problem. Does not bruise/bleed easily.  Skin: Negative for flushing, nail changes, rash and suspicious lesions.  Musculoskeletal: Negative for arthritis, joint pain, muscle cramps, myalgias, neck pain and stiffness.  Gastrointestinal: Negative for abdominal pain, bowel incontinence, diarrhea and excessive appetite.  Genitourinary: Negative for decreased libido, genital sores and incomplete emptying.  Neurological: Negative for brief paralysis, focal weakness, headaches and loss of balance.  Psychiatric/Behavioral: Negative for altered mental status, depression and suicidal ideas.  Allergic/Immunologic: Negative for HIV exposure and persistent infections.     Prior CV studies:   The following studies were reviewed today:  Zio monitor  Patch Wear Time:  14 days and 0 hours starting 06/24/2021. Patient had a min HR of 50 bpm, max HR of 164 bpm, and avg HR of 82 bpm. Predominant underlying rhythm was Sinus Rhythm.  4 Supraventricular Tachycardia runs occurred, the run with the fastest interval lasting 4 beats with a max rate of 164 bpm, the longest lasting 16 beats with an avg rate of 125 bpm. Premature atrial complexes were rare (<1.0%). Premature ventricular complexes were rare (<1.0%). Symptoms were associated with rare premature ventricular complexes.  No ventricular tachycardia, no atrial fibrillation and no pauses noted.  Conclusion: This study is remarkable for rare paroxysmal supraventricular tachycardia.  CCTA 06/30/2021 Aorta:  Normal size.  No calcifications.  No dissection.   Aortic Valve:  Trileaflet.  No calcifications.   Coronary Arteries:  Normal coronary origin.  Right dominance.   RCA is a large dominant artery that gives rise to PDA and PLA. There is no plaque.   Left main is a large artery that gives rise to LAD and LCX arteries.   LAD is a large vessel that has no plaque. This artery gives rise to very small D1 and D2 as well as moderate size  D3 and D4   LCX is a non-dominant artery that gives rise to one moderate size OM1 and OM2 branches. There is no plaque.   Other findings:   Normal pulmonary vein drainage into the left atrium. Right middle pulmonary vein noted - normal variant.   Normal left atrial appendage without a thrombus.   Normal size of the pulmonary artery.   IMPRESSION: 1. Coronary calcium score of 0. This was 0 percentile for age and sex matched control.   2. Normal coronary origin with right dominance.   3. CAD-RADS 0. No evidence of CAD (0%). Consider non-atherosclerotic causes of chest pain.   Georgeanna Lea, MD        Labs/Other Tests and Data Reviewed:    EKG: None today  Recent Labs: 06/24/2021: BUN 18; Creatinine, Ser 0.83; Magnesium 2.3; Potassium 4.2; Sodium 137   Recent Lipid Panel Lab Results  Component Value Date/Time   CHOL 194 06/24/2021 09:55 AM   TRIG 87 06/24/2021 09:55 AM   HDL 34 (L) 06/24/2021 09:55 AM   CHOLHDL 5.7 (H) 06/24/2021 09:55 AM  LDLCALC 144 (H) 06/24/2021 09:55 AM    Wt Readings from Last 3 Encounters:  08/10/21 250 lb (113.4 kg)  06/24/21 253 lb 6.4 oz (114.9 kg)     Objective:    Vital Signs:  BP (!) 156/104   Pulse 76   Ht 6\' 3"  (1.905 m)   Wt 250 lb (113.4 kg)   BMI 31.25 kg/m    Physical exam not performed virtual visit  ASSESSMENT & PLAN:    Hypertension Shortness of breath and exertion Hyperlipidemia Obesity Daytime somnolence Snoring He still is hypertensive.  We will continue his amlodipine 10 mg daily, the hydrochlorothiazide 25 mg a day, carvedilol 25 mg twice daily and now I plan to add valsartan 80 mg daily.  Our goal is less than 130/80 mmHg.  I have asked the patient to take his blood pressure in 1 week and send me information via MyChart at which time we can optimize his antihypertensive medication if needed.  For his snoring and daytime somnolence the patient will benefit from sleep study to rule out sleep apnea.    Still is experiencing shortness of breath and with his hypertension I think is beneficial to get an echocardiogram to assess structural abnormalities as well as any diastolic dysfunction.  COVID-19 Education: The signs and symptoms of COVID-19 were discussed with the patient and how to seek care for testing (follow up with PCP or arrange E-visit).  The importance of social distancing was discussed today.  Time:   Today, I have spent 15 minutes with the patient with telehealth technology discussing the above problems.     Medication Adjustments/Labs and Tests Ordered: Current medicines are reviewed at length with the patient today.  Concerns regarding medicines are outlined above.   Tests Ordered: No orders of the defined types were placed in this encounter.   Medication Changes: No orders of the defined types were placed in this encounter.   Follow Up:  In Person in 3 month(s)  Signed, , DO  08/10/2021 8:59 AM    [facility]

## 2021-09-16 ENCOUNTER — Ambulatory Visit (INDEPENDENT_AMBULATORY_CARE_PROVIDER_SITE_OTHER): Payer: BC Managed Care – PPO

## 2021-09-16 ENCOUNTER — Other Ambulatory Visit: Payer: Self-pay

## 2021-09-16 DIAGNOSIS — R0609 Other forms of dyspnea: Secondary | ICD-10-CM | POA: Diagnosis not present

## 2021-09-16 LAB — ECHOCARDIOGRAM COMPLETE
Area-P 1/2: 2.78 cm2
S' Lateral: 2.8 cm

## 2021-12-05 ENCOUNTER — Other Ambulatory Visit: Payer: Self-pay

## 2021-12-05 ENCOUNTER — Encounter: Payer: Self-pay | Admitting: Emergency Medicine

## 2021-12-05 ENCOUNTER — Emergency Department (HOSPITAL_COMMUNITY)
Admission: EM | Admit: 2021-12-05 | Discharge: 2021-12-05 | Disposition: A | Discharge status: Home / Self Care | Payer: BC Managed Care – PPO | Attending: Emergency Medicine | Admitting: Emergency Medicine

## 2021-12-05 ENCOUNTER — Ambulatory Visit
Admission: EM | Admit: 2021-12-05 | Discharge: 2021-12-05 | Disposition: A | Discharge status: Other Acute Inpatient Hospital | Payer: BC Managed Care – PPO

## 2021-12-05 ENCOUNTER — Emergency Department (HOSPITAL_COMMUNITY): Payer: BC Managed Care – PPO

## 2021-12-05 ENCOUNTER — Encounter (HOSPITAL_COMMUNITY): Payer: Self-pay | Admitting: Emergency Medicine

## 2021-12-05 DIAGNOSIS — I10 Essential (primary) hypertension: Secondary | ICD-10-CM | POA: Insufficient documentation

## 2021-12-05 DIAGNOSIS — J069 Acute upper respiratory infection, unspecified: Secondary | ICD-10-CM | POA: Diagnosis not present

## 2021-12-05 DIAGNOSIS — R0602 Shortness of breath: Secondary | ICD-10-CM | POA: Insufficient documentation

## 2021-12-05 DIAGNOSIS — R059 Cough, unspecified: Secondary | ICD-10-CM | POA: Diagnosis not present

## 2021-12-05 DIAGNOSIS — B349 Viral infection, unspecified: Secondary | ICD-10-CM | POA: Diagnosis not present

## 2021-12-05 DIAGNOSIS — R079 Chest pain, unspecified: Secondary | ICD-10-CM | POA: Diagnosis not present

## 2021-12-05 DIAGNOSIS — Z20822 Contact with and (suspected) exposure to covid-19: Secondary | ICD-10-CM | POA: Insufficient documentation

## 2021-12-05 DIAGNOSIS — R051 Acute cough: Secondary | ICD-10-CM

## 2021-12-05 DIAGNOSIS — Z79899 Other long term (current) drug therapy: Secondary | ICD-10-CM | POA: Insufficient documentation

## 2021-12-05 DIAGNOSIS — R7981 Abnormal blood-gas level: Secondary | ICD-10-CM | POA: Diagnosis not present

## 2021-12-05 HISTORY — DX: Essential (primary) hypertension: I10

## 2021-12-05 LAB — CBC WITH DIFFERENTIAL/PLATELET
Abs Immature Granulocytes: 0.04 10*3/uL (ref 0.00–0.07)
Basophils Absolute: 0 10*3/uL (ref 0.0–0.1)
Basophils Relative: 0 %
Eosinophils Absolute: 0.1 10*3/uL (ref 0.0–0.5)
Eosinophils Relative: 1 %
HCT: 46.1 % (ref 39.0–52.0)
Hemoglobin: 15.6 g/dL (ref 13.0–17.0)
Immature Granulocytes: 0 %
Lymphocytes Relative: 17 %
Lymphs Abs: 2.2 10*3/uL (ref 0.7–4.0)
MCH: 30.4 pg (ref 26.0–34.0)
MCHC: 33.8 g/dL (ref 30.0–36.0)
MCV: 89.7 fL (ref 80.0–100.0)
Monocytes Absolute: 0.9 10*3/uL (ref 0.1–1.0)
Monocytes Relative: 7 %
Neutro Abs: 9.2 10*3/uL — ABNORMAL HIGH (ref 1.7–7.7)
Neutrophils Relative %: 75 %
Platelets: 227 10*3/uL (ref 150–400)
RBC: 5.14 MIL/uL (ref 4.22–5.81)
RDW: 12.8 % (ref 11.5–15.5)
WBC: 12.4 10*3/uL — ABNORMAL HIGH (ref 4.0–10.5)
nRBC: 0 % (ref 0.0–0.2)

## 2021-12-05 LAB — COMPREHENSIVE METABOLIC PANEL
ALT: 25 U/L (ref 0–44)
AST: 19 U/L (ref 15–41)
Albumin: 3.7 g/dL (ref 3.5–5.0)
Alkaline Phosphatase: 58 U/L (ref 38–126)
Anion gap: 11 (ref 5–15)
BUN: 13 mg/dL (ref 6–20)
CO2: 24 mmol/L (ref 22–32)
Calcium: 8.8 mg/dL — ABNORMAL LOW (ref 8.9–10.3)
Chloride: 101 mmol/L (ref 98–111)
Creatinine, Ser: 0.83 mg/dL (ref 0.61–1.24)
GFR, Estimated: >60 mL/min (ref >60)
Glucose, Bld: 105 mg/dL — ABNORMAL HIGH (ref 70–99)
Potassium: 3.9 mmol/L (ref 3.5–5.1)
Sodium: 136 mmol/L (ref 135–145)
Total Bilirubin: 0.8 mg/dL (ref 0.3–1.2)
Total Protein: 6.8 g/dL (ref 6.5–8.1)

## 2021-12-05 LAB — RESP PANEL BY RT-PCR (FLU A&B, COVID) ARPGX2
Influenza A by PCR: NEGATIVE
Influenza B by PCR: NEGATIVE
SARS Coronavirus 2 by RT PCR: NEGATIVE

## 2021-12-05 MED ORDER — PREDNISONE 50 MG PO TABS: ORAL_TABLET | ORAL | 0 refills | Status: AC

## 2021-12-05 MED ORDER — ALBUTEROL SULFATE HFA 108 (90 BASE) MCG/ACT IN AERS: 2.0000 | INHALATION_SPRAY | Freq: Four times a day (QID) | RESPIRATORY_TRACT | 2 refills | Status: AC | PRN

## 2021-12-05 NOTE — Discharge Instructions (Addendum)
Return if any problems.  Follow up with your provider for recheck  ?

## 2021-12-05 NOTE — ED Provider Notes (Signed)
?[facility] EMERGENCY DEPARTMENT ?Provider Note ? ? ?CSN: [id number] ?Arrival date & time: 12/05/21  1141 ? ?  ? ?History ? ?Chief Complaint  ?Patient presents with  ? Shortness of Breath  ? ? ?Alan Phelps is a 41 y.o. male. ? ?Pt reports his 02 sats at home have been between 87-94 ? ?The history is provided by the patient. No language interpreter was used.  ?Shortness of Breath ?Severity:  Moderate ?Onset quality:  Gradual ?Duration:  1 week ?Timing:  Constant ?Progression:  Worsening ?Chronicity:  New ?Context: URI   ?Relieved by:  Nothing ?Worsened by:  Nothing ?Ineffective treatments:  None tried ?Associated symptoms: cough   ?Associated symptoms: no sore throat   ?Risk factors: no recent alcohol use   ? ?  ? ?Home Medications ?Prior to Admission medications   ?Medication Sig Start Date End Date Taking? Authorizing Provider  ?amLODipine (NORVASC) 10 MG tablet Take 1 tablet (10 mg total) by mouth daily. 08/10/21   Tobb, Kardie, DO  ?carvedilol (COREG) 12.5 MG tablet Take 1 tablet (12.5 mg total) by mouth 2 (two) times daily. 07/13/21 10/11/21  Tobb, Lavona [place]ound, DO  ?hydrochlorothiazide (HYDRODIURIL) 25 MG tablet Take 1 tablet (25 mg total) by mouth daily. 08/10/21   Tobb, Kardie, DO  ?rosuvastatin (CRESTOR) 5 MG tablet Take 1 tablet (5 mg total) by mouth daily. 06/27/21 09/25/21  Tobb, Lavona [place]ound, DO  ?valsartan (DIOVAN) 80 MG tablet Take 1 tablet (80 mg total) by mouth daily. 08/10/21   Thomasene Ripple, DO  ?   ? ?Allergies    ?Patient has no known allergies.   ? ?Review of Systems   ?Review of Systems  ?HENT:  Negative for sore throat.   ?Respiratory:  Positive for cough and shortness of breath.   ?All other systems reviewed and are negative. ? ?Physical Exam ?Updated Vital Signs ?BP (!) 122/96 (BP Location: Left Arm)   Pulse 73   Temp 97.8 ?F (36.6 ?C)   Resp 16   SpO2 96%  ?Physical Exam ?Vitals and nursing note reviewed.  ?Constitutional:   ?   Appearance: He is well-developed.  ?HENT:  ?   Head:  Normocephalic.  ?Cardiovascular:  ?   Rate and Rhythm: Normal rate and regular rhythm.  ?Pulmonary:  ?   Effort: Pulmonary effort is normal.  ?   Breath sounds: Examination of the right-lower field reveals rhonchi. Examination of the left-lower field reveals rhonchi. Rhonchi present.  ?Abdominal:  ?   General: There is no distension.  ?Musculoskeletal:     ?   General: Normal range of motion.  ?   Cervical back: Normal range of motion.  ?Skin: ?   General: Skin is warm.  ?Neurological:  ?   Mental Status: He is alert and oriented to person, place, and time.  ?Psychiatric:     ?   Mood and Affect: Mood normal.  ? ? ?ED Results / Procedures / Treatments   ?Labs ?(all labs ordered are listed, but only abnormal results are displayed) ?Labs Reviewed  ?COMPREHENSIVE METABOLIC PANEL - Abnormal; Notable for the following components:  ?    Result Value  ? Glucose, Bld 105 (*)   ? Calcium 8.8 (*)   ? All other components within normal limits  ?CBC WITH DIFFERENTIAL/PLATELET - Abnormal; Notable for the following components:  ? WBC 12.4 (*)   ? Neutro Abs 9.2 (*)   ? All other components within normal limits  ?RESP PANEL BY RT-PCR (FLU A&B, COVID)  ARPGX2  ? ? ?EKG ?None ? ?Radiology ?DG Chest 2 View ? ?Result Date: 12/05/2021 ?CLINICAL DATA:  Shortness of breath with nonproductive cough pain and chest pain for 1 week. EXAM: CHEST - 2 VIEW COMPARISON:  Radiographs 05/05/2021.  CT 06/29/2021. FINDINGS: The heart size and mediastinal contours are normal. The lungs are clear. There is no pleural effusion or pneumothorax. No acute osseous findings are identified. IMPRESSION: Stable chest.  No active cardiopulmonary process. Electronically Signed   By: Carey Bullocks M.D.   On: 12/05/2021 12:27   ? ?Procedures ?Procedures  ? ? ?Medications Ordered in ED ?Medications - No data to display ? ?ED Course/ Medical Decision Making/ A&P ?  ?                        ?Medical Decision Making ?Pt has a had a cough for the past week.   ? ?Problems  Addressed: ?Viral illness: acute illness or injury ? ?Amount and/or Complexity of Data Reviewed ?Independent Historian: spouse ?External Data Reviewed: notes. ?   Details: urgent care notes reviewed ?Labs: ordered. Decision-making details documented in ED Course. ?   Details: Covid and influenza are negative ?Radiology: ordered and independent interpretation performed. Decision-making details documented in ED Course. ?   Details: Chest xray ordered, reviewed adn discussed ? ?Risk ?Prescription drug management. ?Risk Details: Pt has a cough and shortness of breath.  Pt given rx for prednisone and albuterol   ? ? ? ? ? ? ? ? ? ?Final Clinical Impression(s) / ED Diagnoses ?Final diagnoses:  ?Viral illness  ? ? ?Rx / DC Orders ?ED Discharge Orders   ? ?      Ordered  ?  predniSONE (DELTASONE) 50 MG tablet       ? 12/05/21 1506  ?  albuterol (VENTOLIN HFA) 108 (90 Base) MCG/ACT inhaler  Every 6 hours PRN       ? 12/05/21 1506  ? ?  ?  ? ?  ? ?.qavs ?  ?Elson Areas, [place] ?12/05/21 1512 ? ?  ?Benjiman Core, MD ?12/06/21 1236 ? ?

## 2021-12-05 NOTE — ED Notes (Signed)
Pt verbalized understanding of d/c instructions, meds, and followup care. Denies questions. VSS, no distress noted. Steady gait to exit with all belongings.  ?

## 2021-12-05 NOTE — ED Triage Notes (Addendum)
Cough x 1 week, hoarseness. ? ?Took tylenol around 8:30 this morning. ?

## 2021-12-05 NOTE — ED Provider Notes (Signed)
?[place] ? ? ? ?CSN: [id number] ?Arrival date & time: 12/05/21  1031 ? ? ?  ? ?History   ?Chief Complaint ?Chief Complaint  ?Patient presents with  ? Cough  ? ? ?HPI ?Anthonyjohn Bage is a 41 y.o. male.  ? ?Patient presents with 1 week history of hoarseness, cough, nasal congestion.  Denies any known sick contacts.  Tmax at home was 101.  Last known fever was approximately 3 days ago.  Patient has taken Tylenol for symptoms with minimal improvement.  He also reports associated intermittent shortness of breath but denies chest pain.  Denies nausea, vomiting, diarrhea, abdominal pain.  Denies any history of asthma or COPD but patient reports that he is a smoker.  Patient also reports that he has been monitoring his oxygen at home and is ranging from 87 to 94%. ? ? ?Cough ? ?Past Medical History:  ?Diagnosis Date  ? Intermittent chest pain   ? Palpitations   ? ? ?Patient Active Problem List  ? Diagnosis Date Noted  ? Intermittent chest pain 06/24/2021  ? Palpitations 06/24/2021  ? ? ?Past Surgical History:  ?Procedure Laterality Date  ? KNEE SURGERY    ? TONSILLECTOMY    ? ? ? ? ? ?Home Medications   ? ?Prior to Admission medications   ?Medication Sig Start Date End Date Taking? Authorizing Provider  ?amLODipine (NORVASC) 10 MG tablet Take 1 tablet (10 mg total) by mouth daily. 08/10/21  Yes Tobb, Kardie, DO  ?hydrochlorothiazide (HYDRODIURIL) 25 MG tablet Take 1 tablet (25 mg total) by mouth daily. 08/10/21  Yes Tobb, Kardie, DO  ?valsartan (DIOVAN) 80 MG tablet Take 1 tablet (80 mg total) by mouth daily. 08/10/21  Yes Tobb, Kardie, DO  ?carvedilol (COREG) 12.5 MG tablet Take 1 tablet (12.5 mg total) by mouth 2 (two) times daily. 07/13/21 10/11/21  Tobb, Godfrey Pick, DO  ?rosuvastatin (CRESTOR) 5 MG tablet Take 1 tablet (5 mg total) by mouth daily. 06/27/21 09/25/21  Berniece Salines, DO  ? ? ?Family History ?Family History  ?Problem Relation Age of Onset  ? Hypertension Father   ? ? ?Social History ?Social History   ? ?Tobacco Use  ? Smoking status: Every Day  ?  Types: Cigarettes  ? Smokeless tobacco: Never  ?Substance Use Topics  ? Alcohol use: Yes  ?  Comment: ocass  ? Drug use: Never  ? ? ? ?Allergies   ?Patient has no known allergies. ? ? ?Review of Systems ?Review of Systems ?Per HPI ? ?Physical Exam ?Triage Vital Signs ?ED Triage Vitals  ?Enc Vitals Group  ?   BP 12/05/21 1058 117/79  ?   Pulse Rate 12/05/21 1058 76  ?   Resp 12/05/21 1058 16  ?   Temp 12/05/21 1058 98.1 ?F (36.7 ?C)  ?   Temp Source 12/05/21 1058 Oral  ?   SpO2 12/05/21 1058 93 %  ?   Weight --   ?   Height --   ?   Head Circumference --   ?   Peak Flow --   ?   Pain Score 12/05/21 1057 6  ?   Pain Loc --   ?   Pain Edu? --   ?   Excl. in [place]? --   ? ?No data found. ? ?Updated Vital Signs ?BP 117/79 (BP Location: Right Arm)   Pulse 76   Temp 98.1 ?F (36.7 ?C) (Oral)   Resp 16   SpO2 93%  ? ?Visual Acuity ?Right  Eye Distance:   ?Left Eye Distance:   ?Bilateral Distance:   ? ?Right Eye Near:   ?Left Eye Near:    ?Bilateral Near:    ? ?Physical Exam ?Constitutional:   ?   General: He is not in acute distress. ?   Appearance: Normal appearance. He is not toxic-appearing or diaphoretic.  ?HENT:  ?   Head: Normocephalic and atraumatic.  ?   Right Ear: Tympanic membrane and ear canal normal.  ?   Left Ear: Tympanic membrane and ear canal normal.  ?   Nose: Congestion present.  ?   Mouth/Throat:  ?   Mouth: Mucous membranes are moist.  ?   Pharynx: No posterior oropharyngeal erythema.  ?Eyes:  ?   Extraocular Movements: Extraocular movements intact.  ?   Conjunctiva/sclera: Conjunctivae normal.  ?   Pupils: Pupils are equal, round, and reactive to light.  ?Cardiovascular:  ?   Rate and Rhythm: Normal rate and regular rhythm.  ?   Pulses: Normal pulses.  ?   Heart sounds: Normal heart sounds.  ?Pulmonary:  ?   Effort: Pulmonary effort is normal. No respiratory distress.  ?   Breath sounds: No stridor. No wheezing, rhonchi or rales.  ?   Comments: Diminished  breath sounds throughout. ?Abdominal:  ?   General: Abdomen is flat. Bowel sounds are normal.  ?   Palpations: Abdomen is soft.  ?Musculoskeletal:     ?   General: Normal range of motion.  ?   Cervical back: Normal range of motion.  ?Skin: ?   General: Skin is warm and dry.  ?Neurological:  ?   General: No focal deficit present.  ?   Mental Status: He is alert and oriented to person, place, and time. Mental status is at baseline.  ?Psychiatric:     ?   Mood and Affect: Mood normal.     ?   Behavior: Behavior normal.  ? ? ? ?UC Treatments / Results  ?Labs ?(all labs ordered are listed, but only abnormal results are displayed) ?Labs Reviewed - No data to display ? ?EKG ? ? ?Radiology ?No results found. ? ?Procedures ?Procedures (including critical care time) ? ?Medications Ordered in UC ?Medications - No data to display ? ?Initial Impression / Assessment and Plan / UC Course  ?I have reviewed the triage vital signs and the nursing notes. ? ?Pertinent labs & imaging results that were available during my care of the patient were reviewed by me and considered in my medical decision making (see chart for details). ? ?  ? ?Patient's oxygen saturation was ranging from 90 to 91% during initial triage and physical exam.  Patient also reported that his oxygen was 87 to 94% at home.  Due to this, I do think the patient needs a more extensive evaluation than can be provided at the urgent care.  Suggested the patient go to the hospital for further evaluation and management.  Patient was agreeable with plan.  Suggested EMS transport due to oxygen but the patient declined.  Patient wished for his family to transport to the hospital.  Patient left via self transport. ?Final Clinical Impressions(s) / UC Diagnoses  ? ?Final diagnoses:  ?Acute upper respiratory infection  ?Acute cough  ?Low oxygen saturation  ? ? ? ?Discharge Instructions   ? ?  ?Please go to the emergency department as soon as you leave urgent care for further  evaluation and management. ? ? ? ?ED Prescriptions   ?None ?  ? ?  PDMP not reviewed this encounter. ?  ?Teodora Medici, [place] ?12/05/21 1127 ? ?

## 2021-12-05 NOTE — ED Triage Notes (Signed)
Patient here with complaint of voice hoarseness, unproductive cough, and shortness of breath that started last Wednesday. Afebrile in triage, room air SpO2 96%, patient in no apparent distress at this time. ?

## 2021-12-05 NOTE — Discharge Instructions (Signed)
Please go to the emergency department as soon as you leave urgent care for further evaluation and management. ?

## 2021-12-07 ENCOUNTER — Encounter: Payer: Self-pay | Admitting: Cardiology

## 2021-12-07 ENCOUNTER — Other Ambulatory Visit: Payer: Self-pay

## 2021-12-07 ENCOUNTER — Ambulatory Visit: Payer: BC Managed Care – PPO | Admitting: Cardiology

## 2021-12-07 VITALS — BP 112/60 | HR 62 | Ht 75.0 in | Wt 269.2 lb

## 2021-12-07 DIAGNOSIS — I10 Essential (primary) hypertension: Secondary | ICD-10-CM

## 2021-12-07 DIAGNOSIS — I6529 Occlusion and stenosis of unspecified carotid artery: Secondary | ICD-10-CM

## 2021-12-07 DIAGNOSIS — E782 Mixed hyperlipidemia: Secondary | ICD-10-CM

## 2021-12-07 DIAGNOSIS — R002 Palpitations: Secondary | ICD-10-CM

## 2021-12-07 NOTE — Patient Instructions (Addendum)
Medication Instructions: Your physician recommends that you continue on your current medications as directed. Please refer to the Current Medication list given to you today. ?Re check Cardiac CT Calcium Score in next 5 years ? ?Check BP's at Home ?*If you need a refill on your cardiac medications before your next appointment, please call your pharmacy* ? ? ?Lab Work: ?Your physician recommends that you return for lab work in: Fasting Lipids and LPa ? ?If you have labs (blood work) drawn today and your tests are completely normal, you will receive your results only by: ?MyChart Message (if you have MyChart) OR ?A paper copy in the mail ?If you have any lab test that is abnormal or we need to change your treatment, we will call you to review the results. ? ? ? ?Follow-Up: ?At [facility], you and your health needs are our priority.  As part of our continuing mission to provide you with exceptional heart care, we have created designated Provider Care Teams.  These Care Teams include your primary Cardiologist (physician) and Advanced Practice Providers (APPs -  Physician Assistants and Nurse Practitioners) who all work together to provide you with the care you need, when you need it. ? ?We recommend signing up for the patient portal called "MyChart".  Sign up information is provided on this After Visit Summary.  MyChart is used to connect with patients for Virtual Visits (Telemedicine).  Patients are able to view lab/test results, encounter notes, upcoming appointments, etc.  Non-urgent messages can be sent to your provider as well.   ?To learn more about what you can do with MyChart, go to [email or web address].   ? ?Your next appointment:   ? As Needed for cardiac needs ? ?The format for your next appointment:   ?In Person ? ?Provider:   ?Norman Herrlich, MD  ? ? ?Other Instructions ?  ?

## 2021-12-07 NOTE — Progress Notes (Signed)
?Cardiology Office Note:   ? ?Date:  12/07/2021  ? ?ID:  Alan Phelps, DOB 12-13-1980, MRN [id number] ? ?PCP:  Kaleen Mask, MD  ?Cardiologist:  Norman Herrlich, MD   ? ?Referring MD: Kaleen Mask, *  ? ? ?ASSESSMENT:   ? ?1. Primary hypertension   ?2. Palpitations   ?3. Carotid atherosclerosis, unspecified laterality   ?4. Mixed hyperlipidemia   ? ?PLAN:   ? ?In order of problems listed above: ? ?Alan Phelps is markedly improved with hypertensive control continue his current combination hydrochlorothiazide and carvedilol amlodipine recheck renal function potassium.  I agree with him that lifestyle changes would be beneficial activity weight loss and sodium restriction he will follow-up for hypertension with his PCP ?Resolved ?Despite a calcium score is 0 I agree he should stay on statin continue rosuvastatin I will check a lipid profile today ?Advised him in 5 years to have a coronary calcium score done ? ? ?Next appointment: As needed ? ? ?Medication Adjustments/Labs and Tests Ordered: ?Current medicines are reviewed at length with the patient today.  Concerns regarding medicines are outlined above.  ?Orders Placed This Encounter  ?Procedures  ? Lipid panel  ? Lipoprotein A (LPA)  ? ?No orders of the defined types were placed in this encounter. ? ? ?Chief Complaint  ?Patient presents with  ? Hypertension  ? ? ?History of Present Illness:   ? ?Alan Phelps is a 41 y.o. male with a hx of hypertension last seen 06/24/2021 with complaints including palpitation and chest pain.. ? ?Compliance with diet, lifestyle and medications: Yes ? 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 ?Relates in the past he had a [facility] screening and  had carotid atherosclerosis he prefers to stay on a statin with a calcium score of 0 ? ?He underwent a very extensive evaluation including: ? ?Echocardiogram 09/16/2021 mild concentric LVH normal volume normal ejection fraction 55 to 60% and normal diastolic function right ventricle normal size function and pulmonary artery pressure and there is no valvular abnormality. ? ?He is to 14-day event monitor reported 07/20/2021 showing rare brief runs of atrial premature contractions the longest 16 complexes at a rate of 125 bpm is symptomatic events were associated with rare PVCs. ? ?He had a cardiac CTA performed 06/30/2022 with a calcium score of 0 normal coronary origin course in the absence of CAD there is no vascular calcification and no abnormality on radiology over read of the chest CT ?Past Medical History:  ?Diagnosis Date  ? Hypertension   ? Intermittent chest pain   ? Palpitations   ? ? ?Past Surgical History:  ?Procedure Laterality Date  ? KNEE SURGERY    ? TONSILLECTOMY    ? ? ?Current Medications: ?Current Meds  ?Medication Sig  ? albuterol (VENTOLIN HFA) 108 (90 Base) MCG/ACT inhaler Inhale 2 puffs into the lungs every 6 (six) hours as needed for wheezing or shortness of breath.  ? amLODipine (NORVASC) 10 MG tablet Take 1 tablet (10 mg total) by mouth daily.  ? carvedilol (COREG) 12.5 MG tablet Take 1 tablet (12.5 mg total) by mouth 2 (two) times daily. (Patient taking differently: Take 12.5 mg by mouth daily.)  ? hydrochlorothiazide (HYDRODIURIL) 25 MG tablet Take 1 tablet (25 mg total) by mouth  daily.  ? predniSONE (DELTASONE) 50 MG tablet One tablet a day  ? rosuvastatin (CRESTOR) 5 MG tablet Take 1 tablet (5 mg total) by mouth daily.  ? valsartan-hydrochlorothiazide (DIOVAN-HCT) 160-12.5 MG tablet Take 1 tablet by mouth daily.  ?  ? ?Allergies:   Patient has no known allergies.  ? ?Social History  ? ?Socioeconomic History  ? Marital status: Single  ?  Spouse name: Not on file  ? Number of children: Not  on file  ? Years of education: Not on file  ? Highest education level: Not on file  ?Occupational History  ? Not on file  ?Tobacco Use  ? Smoking status: Every Day  ?  Types: Cigarettes  ? Smokeless tobacco: Never  ?Substance and Sexual Activity  ? Alcohol use: Yes  ?  Comment: ocass  ? Drug use: Never  ? Sexual activity: Not on file  ?Other Topics Concern  ? Not on file  ?Social History Narrative  ? Not on file  ? ?Social Determinants of Health  ? ?Financial Resource Strain: Not on file  ?Food Insecurity: Not on file  ?Transportation Needs: Not on file  ?Physical Activity: Not on file  ?Stress: Not on file  ?Social Connections: Not on file  ?  ? ?Family History: ?The patient's family history includes Hypertension in his father. ?ROS:   ?Please see the history of present illness.    ?All other systems reviewed and are negative. ? ?EKGs/Labs/Other Studies Reviewed:   ? ?The following studies were reviewed today: ? ? ? ?Recent Labs: ?06/24/2021: Magnesium 2.3 ?12/05/2021: ALT 25; BUN 13; Creatinine, Ser 0.83; Hemoglobin 15.6; Platelets 227; Potassium 3.9; Sodium 136  ?Recent Lipid Panel ?   ?Component Value Date/Time  ? CHOL 194 06/24/2021 0955  ? TRIG 87 06/24/2021 0955  ? HDL 34 (L) 06/24/2021 0955  ? CHOLHDL 5.7 (H) 06/24/2021 0955  ? LDLCALC 144 (H) 06/24/2021 0955  ? ? ?Physical Exam:   ? ?VS:  BP 112/60 (BP Location: Left Arm)   Pulse 62   Ht 6\' 3"  (1.905 m)   Wt 269 lb 3.2 oz (122.1 kg)   SpO2 96%   BMI 33.65 kg/m?    ? ?Wt Readings from Last 3 Encounters:  ?12/07/21 269 lb 3.2 oz (122.1 kg)  ?08/10/21 250 lb (113.4 kg)  ?06/24/21 253 lb 6.4 oz (114.9 kg)  ?  ? ?GEN:  Well nourished, well developed in no acute distress ?HEENT: Normal ?NECK: No JVD; No carotid bruits ?LYMPHATICS: No lymphadenopathy ?CARDIAC: RRR, no murmurs, rubs, gallops ?RESPIRATORY:  Clear to auscultation without rales, wheezing or rhonchi  ?ABDOMEN: Soft, non-tender, non-distended ?MUSCULOSKELETAL:  No edema; No deformity  ?SKIN: Warm and  dry ?NEUROLOGIC:  Alert and oriented x 3 ?PSYCHIATRIC:  Normal affect  ? ? ?Signed, ?06/26/21, MD  ?12/07/2021 4:53 PM    ?[facility]  ?

## 2021-12-08 LAB — LIPID PANEL
Chol/HDL Ratio: 5.9 ratio — ABNORMAL HIGH (ref 0.0–5.0)
Cholesterol, Total: 164 mg/dL (ref 100–199)
HDL: 28 mg/dL — ABNORMAL LOW (ref >39)
LDL Chol Calc (NIH): 105 mg/dL — ABNORMAL HIGH (ref 0–99)
Triglycerides: 176 mg/dL — ABNORMAL HIGH (ref 0–149)
VLDL Cholesterol Cal: 31 mg/dL (ref 5–40)

## 2021-12-08 LAB — LIPOPROTEIN A (LPA): Lipoprotein (a): 21.2 nmol/L (ref <75.0)

## 2021-12-16 ENCOUNTER — Ambulatory Visit: Payer: BC Managed Care – PPO | Admitting: Cardiology

## 2023-01-25 IMAGING — CT CT HEART MORP W/ CTA COR W/ SCORE W/ CA W/CM &/OR W/O CM
4 of 7 series · 8 of 20 positions shown, 9 images · IV contrast (APPLIED)
Comparison: None.
COMPARISON: None.

Addendum:
EXAM:
OVER-READ INTERPRETATION  CT CHEST

The following report is an over-read performed by radiologist Dr.
Rosta Borjian [[email or web address]] on 06/30/2021. This
over-read does not include interpretation of cardiac or coronary
anatomy or pathology. The coronary CTA interpretation by the
cardiologist is attached.
CLINICAL DATA: CP
Cardiac/Coronary  CTA
TECHNIQUE: The patient was scanned on a [employer] Force scanner.

[Series 6: best diast · axial · 0.39mm/px · z∈[+1258,+1301]mm · 2 of 326 slices shown, 3 images]
[im 109/326  vessel]
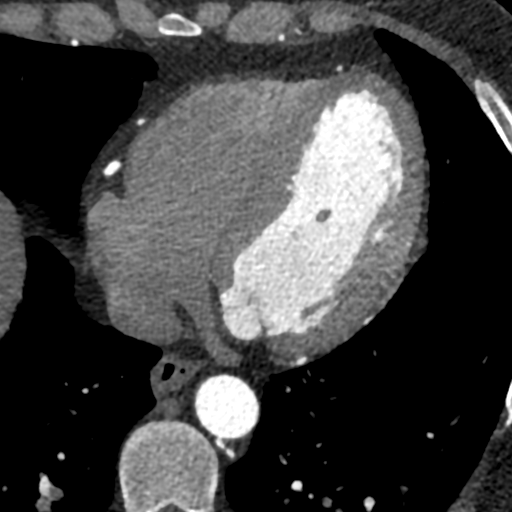
[im 109/326  lung]
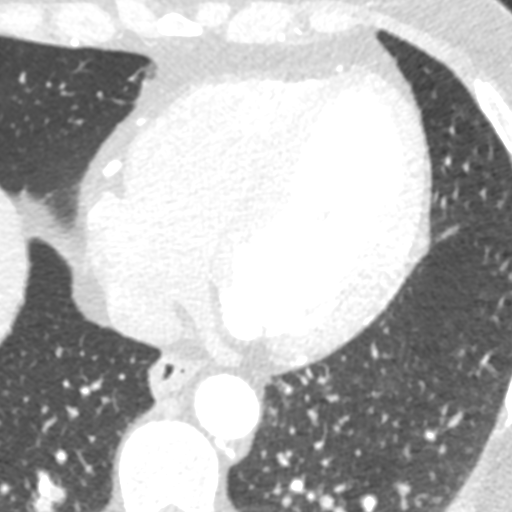
[im 217/326  vessel]
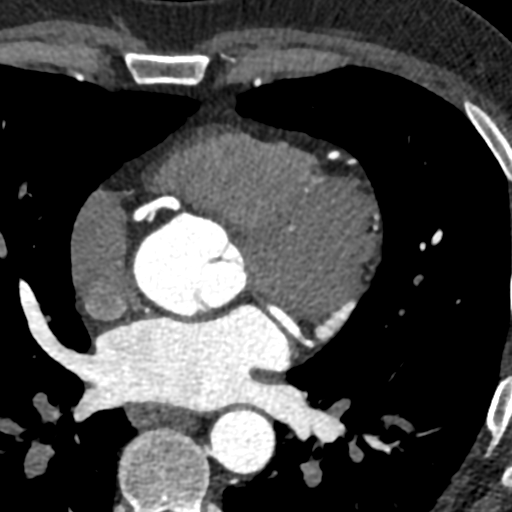

[Series 7: best syst · axial · 0.39mm/px · z∈[+1258,+1301]mm · 2 of 326 slices shown]
[im 109/326  vessel]
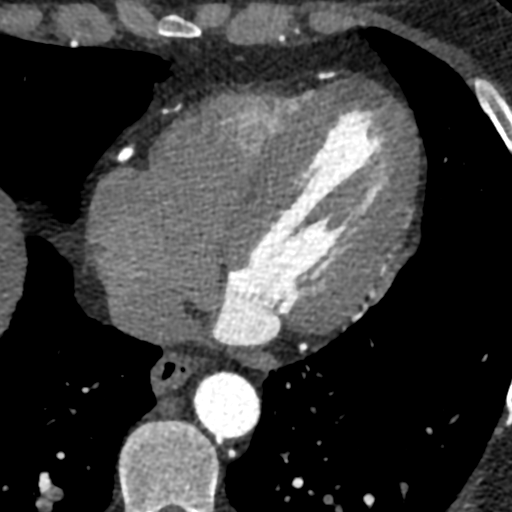
[im 217/326  vessel]
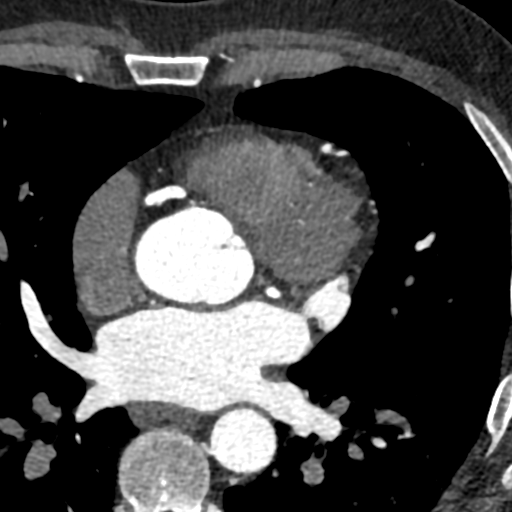

[Series 8: ts diast sharp · axial · 0.39mm/px · z∈[+1258,+1301]mm · 2 of 326 slices shown]
[im 109/326  lung]
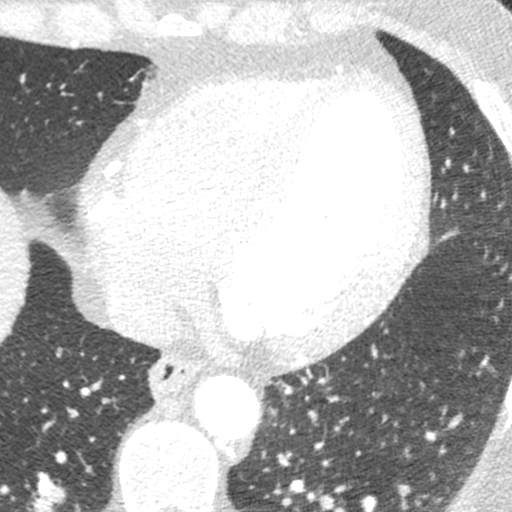
[im 217/326  lung]
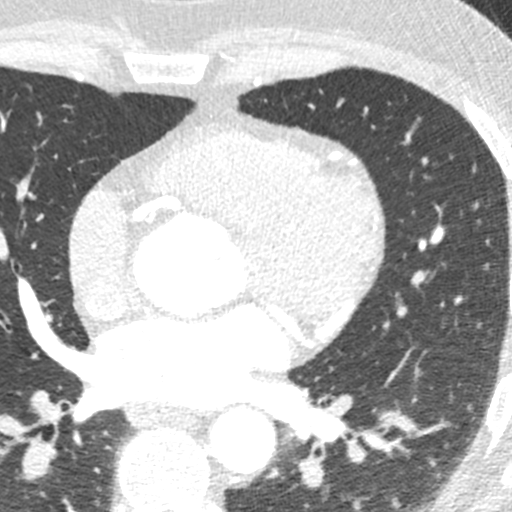

[Series 9: ts syst sharp · axial · 0.39mm/px · z∈[+1258,+1301]mm · 2 of 326 slices shown]
[im 109/326  lung]
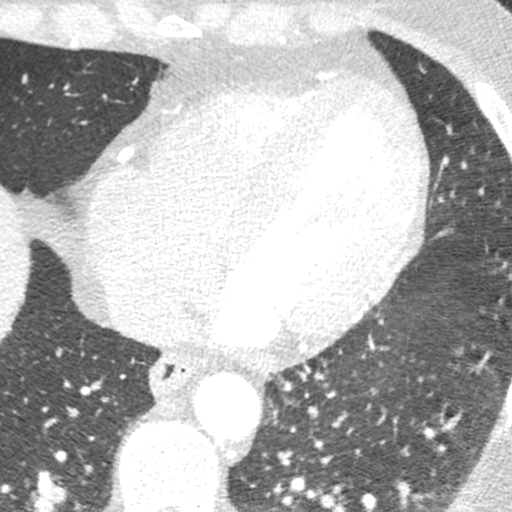
[im 217/326  lung]
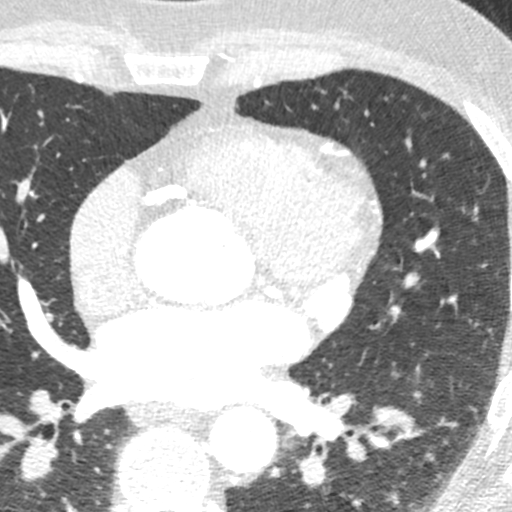

[8 of 20 positions shown; findings below may reference images not displayed]

FINDINGS: Vascular: No significant noncardiac vascular findings.

Mediastinum/Nodes: Visualized mediastinum and hilar regions
demonstrate no lymphadenopathy or masses.

Lungs/Pleura: Visualized lungs show no evidence of pulmonary edema,
consolidation, pneumothorax, nodule or pleural fluid.

Upper Abdomen: No acute abnormality.

Musculoskeletal: No chest wall mass or suspicious bone lesions
identified.
IMPRESSION: No significant incidental findings.
FINDINGS: A 100 kV prospective scan was triggered in the descending thoracic
aorta at 111 HU's. Axial non-contrast 3 mm slices were carried out
through the heart. The data set was analyzed on a dedicated work
station and scored using the Agatson method. Gantry rotation speed
was 250 msecs and collimation was .6 mm. No beta blockade and 0.8 mg
of sl NTG was given. The 3D data set was reconstructed in 5%
intervals of the 67-82 % of the R-R cycle. Diastolic phases were
analyzed on a dedicated work station using MPR, MIP and VRT modes.
The patient received 80 cc of contrast.

Aorta:  Normal size.  No calcifications.  No dissection.

Aortic Valve:  Trileaflet.  No calcifications.

Coronary Arteries:  Normal coronary origin.  Right dominance.

RCA is a large dominant artery that gives rise to PDA and PLA. There
is no plaque.

Left main is a large artery that gives rise to LAD and LCX arteries.

LAD is a large vessel that has no plaque. This artery gives rise to
very small D1 and D2 as well as moderate size D3 and D4

LCX is a non-dominant artery that gives rise to one moderate size
OM1 and OM2 branches. There is no plaque.

Other findings:

Normal pulmonary vein drainage into the left atrium. Right middle
pulmonary vein noted - normal variant.

Normal left atrial appendage without a thrombus.

Normal size of the pulmonary artery.
IMPRESSION: 1. Coronary calcium score of 0. This was 0 percentile for age and
sex matched control.

2. Normal coronary origin with right dominance.

3. CAD-RADS 0. No evidence of CAD (0%). Consider non-atherosclerotic
causes of chest pain.

*** End of Addendum ***
EXAM:
OVER-READ INTERPRETATION  CT CHEST

The following report is an over-read performed by radiologist Dr.
Rosta Borjian [[email or web address]] on 06/30/2021. This
over-read does not include interpretation of cardiac or coronary
anatomy or pathology. The coronary CTA interpretation by the
cardiologist is attached.
FINDINGS: Vascular: No significant noncardiac vascular findings.

Mediastinum/Nodes: Visualized mediastinum and hilar regions
demonstrate no lymphadenopathy or masses.

Lungs/Pleura: Visualized lungs show no evidence of pulmonary edema,
consolidation, pneumothorax, nodule or pleural fluid.

Upper Abdomen: No acute abnormality.

Musculoskeletal: No chest wall mass or suspicious bone lesions
identified.
IMPRESSION: No significant incidental findings.

## 2023-02-28 DIAGNOSIS — F411 Generalized anxiety disorder: Secondary | ICD-10-CM | POA: Diagnosis not present

## 2023-02-28 DIAGNOSIS — Z635 Disruption of family by separation and divorce: Secondary | ICD-10-CM | POA: Diagnosis not present

## 2023-03-20 DIAGNOSIS — M5413 Radiculopathy, cervicothoracic region: Secondary | ICD-10-CM | POA: Diagnosis not present

## 2023-03-20 DIAGNOSIS — M9901 Segmental and somatic dysfunction of cervical region: Secondary | ICD-10-CM | POA: Diagnosis not present

## 2023-03-20 DIAGNOSIS — M50321 Other cervical disc degeneration at C4-C5 level: Secondary | ICD-10-CM | POA: Diagnosis not present

## 2023-03-20 DIAGNOSIS — M50322 Other cervical disc degeneration at C5-C6 level: Secondary | ICD-10-CM | POA: Diagnosis not present

## 2023-03-21 DIAGNOSIS — M50322 Other cervical disc degeneration at C5-C6 level: Secondary | ICD-10-CM | POA: Diagnosis not present

## 2023-03-21 DIAGNOSIS — M5413 Radiculopathy, cervicothoracic region: Secondary | ICD-10-CM | POA: Diagnosis not present

## 2023-03-21 DIAGNOSIS — M9901 Segmental and somatic dysfunction of cervical region: Secondary | ICD-10-CM | POA: Diagnosis not present

## 2023-03-21 DIAGNOSIS — M50321 Other cervical disc degeneration at C4-C5 level: Secondary | ICD-10-CM | POA: Diagnosis not present

## 2023-03-22 DIAGNOSIS — Z0000 Encounter for general adult medical examination without abnormal findings: Secondary | ICD-10-CM | POA: Diagnosis not present

## 2023-03-29 DIAGNOSIS — M50321 Other cervical disc degeneration at C4-C5 level: Secondary | ICD-10-CM | POA: Diagnosis not present

## 2023-03-29 DIAGNOSIS — M50322 Other cervical disc degeneration at C5-C6 level: Secondary | ICD-10-CM | POA: Diagnosis not present

## 2023-03-29 DIAGNOSIS — M9901 Segmental and somatic dysfunction of cervical region: Secondary | ICD-10-CM | POA: Diagnosis not present

## 2023-03-29 DIAGNOSIS — M5413 Radiculopathy, cervicothoracic region: Secondary | ICD-10-CM | POA: Diagnosis not present

## 2023-04-02 DIAGNOSIS — M5413 Radiculopathy, cervicothoracic region: Secondary | ICD-10-CM | POA: Diagnosis not present

## 2023-04-02 DIAGNOSIS — M9901 Segmental and somatic dysfunction of cervical region: Secondary | ICD-10-CM | POA: Diagnosis not present

## 2023-04-02 DIAGNOSIS — M50321 Other cervical disc degeneration at C4-C5 level: Secondary | ICD-10-CM | POA: Diagnosis not present

## 2023-04-02 DIAGNOSIS — M50322 Other cervical disc degeneration at C5-C6 level: Secondary | ICD-10-CM | POA: Diagnosis not present

## 2023-04-04 DIAGNOSIS — M9901 Segmental and somatic dysfunction of cervical region: Secondary | ICD-10-CM | POA: Diagnosis not present

## 2023-04-04 DIAGNOSIS — M50322 Other cervical disc degeneration at C5-C6 level: Secondary | ICD-10-CM | POA: Diagnosis not present

## 2023-04-04 DIAGNOSIS — M5413 Radiculopathy, cervicothoracic region: Secondary | ICD-10-CM | POA: Diagnosis not present

## 2023-04-04 DIAGNOSIS — M50321 Other cervical disc degeneration at C4-C5 level: Secondary | ICD-10-CM | POA: Diagnosis not present

## 2023-04-11 DIAGNOSIS — M50322 Other cervical disc degeneration at C5-C6 level: Secondary | ICD-10-CM | POA: Diagnosis not present

## 2023-04-11 DIAGNOSIS — M9901 Segmental and somatic dysfunction of cervical region: Secondary | ICD-10-CM | POA: Diagnosis not present

## 2023-04-11 DIAGNOSIS — M5413 Radiculopathy, cervicothoracic region: Secondary | ICD-10-CM | POA: Diagnosis not present

## 2023-04-11 DIAGNOSIS — M50321 Other cervical disc degeneration at C4-C5 level: Secondary | ICD-10-CM | POA: Diagnosis not present

## 2023-04-21 DIAGNOSIS — G4730 Sleep apnea, unspecified: Secondary | ICD-10-CM | POA: Diagnosis not present

## 2023-04-25 DIAGNOSIS — M5413 Radiculopathy, cervicothoracic region: Secondary | ICD-10-CM | POA: Diagnosis not present

## 2023-04-25 DIAGNOSIS — M50321 Other cervical disc degeneration at C4-C5 level: Secondary | ICD-10-CM | POA: Diagnosis not present

## 2023-04-25 DIAGNOSIS — M9901 Segmental and somatic dysfunction of cervical region: Secondary | ICD-10-CM | POA: Diagnosis not present

## 2023-04-25 DIAGNOSIS — M50322 Other cervical disc degeneration at C5-C6 level: Secondary | ICD-10-CM | POA: Diagnosis not present

## 2023-04-30 DIAGNOSIS — Z635 Disruption of family by separation and divorce: Secondary | ICD-10-CM | POA: Diagnosis not present

## 2023-04-30 DIAGNOSIS — F411 Generalized anxiety disorder: Secondary | ICD-10-CM | POA: Diagnosis not present

## 2023-05-02 DIAGNOSIS — M5413 Radiculopathy, cervicothoracic region: Secondary | ICD-10-CM | POA: Diagnosis not present

## 2023-05-02 DIAGNOSIS — M50322 Other cervical disc degeneration at C5-C6 level: Secondary | ICD-10-CM | POA: Diagnosis not present

## 2023-05-02 DIAGNOSIS — M50321 Other cervical disc degeneration at C4-C5 level: Secondary | ICD-10-CM | POA: Diagnosis not present

## 2023-05-02 DIAGNOSIS — M9901 Segmental and somatic dysfunction of cervical region: Secondary | ICD-10-CM | POA: Diagnosis not present

## 2023-05-03 DIAGNOSIS — Z1329 Encounter for screening for other suspected endocrine disorder: Secondary | ICD-10-CM | POA: Diagnosis not present

## 2023-05-03 DIAGNOSIS — Z0000 Encounter for general adult medical examination without abnormal findings: Secondary | ICD-10-CM | POA: Diagnosis not present

## 2023-06-13 DIAGNOSIS — L814 Other melanin hyperpigmentation: Secondary | ICD-10-CM | POA: Diagnosis not present

## 2023-06-13 DIAGNOSIS — D485 Neoplasm of uncertain behavior of skin: Secondary | ICD-10-CM | POA: Diagnosis not present

## 2023-06-13 DIAGNOSIS — D225 Melanocytic nevi of trunk: Secondary | ICD-10-CM | POA: Diagnosis not present

## 2023-06-13 DIAGNOSIS — L578 Other skin changes due to chronic exposure to nonionizing radiation: Secondary | ICD-10-CM | POA: Diagnosis not present

## 2023-06-25 DIAGNOSIS — Z635 Disruption of family by separation and divorce: Secondary | ICD-10-CM | POA: Diagnosis not present

## 2023-06-25 DIAGNOSIS — F411 Generalized anxiety disorder: Secondary | ICD-10-CM | POA: Diagnosis not present

## 2023-07-03 IMAGING — CR DG CHEST 2V
2 series · 2 of 2 positions shown · non-contrast
Comparison: Radiographs 05/05/2021.  CT 06/29/2021.

CLINICAL DATA: Shortness of breath with nonproductive cough pain
and chest pain for 1 week.

EXAM:
CHEST - 2 VIEW

[chest pa]
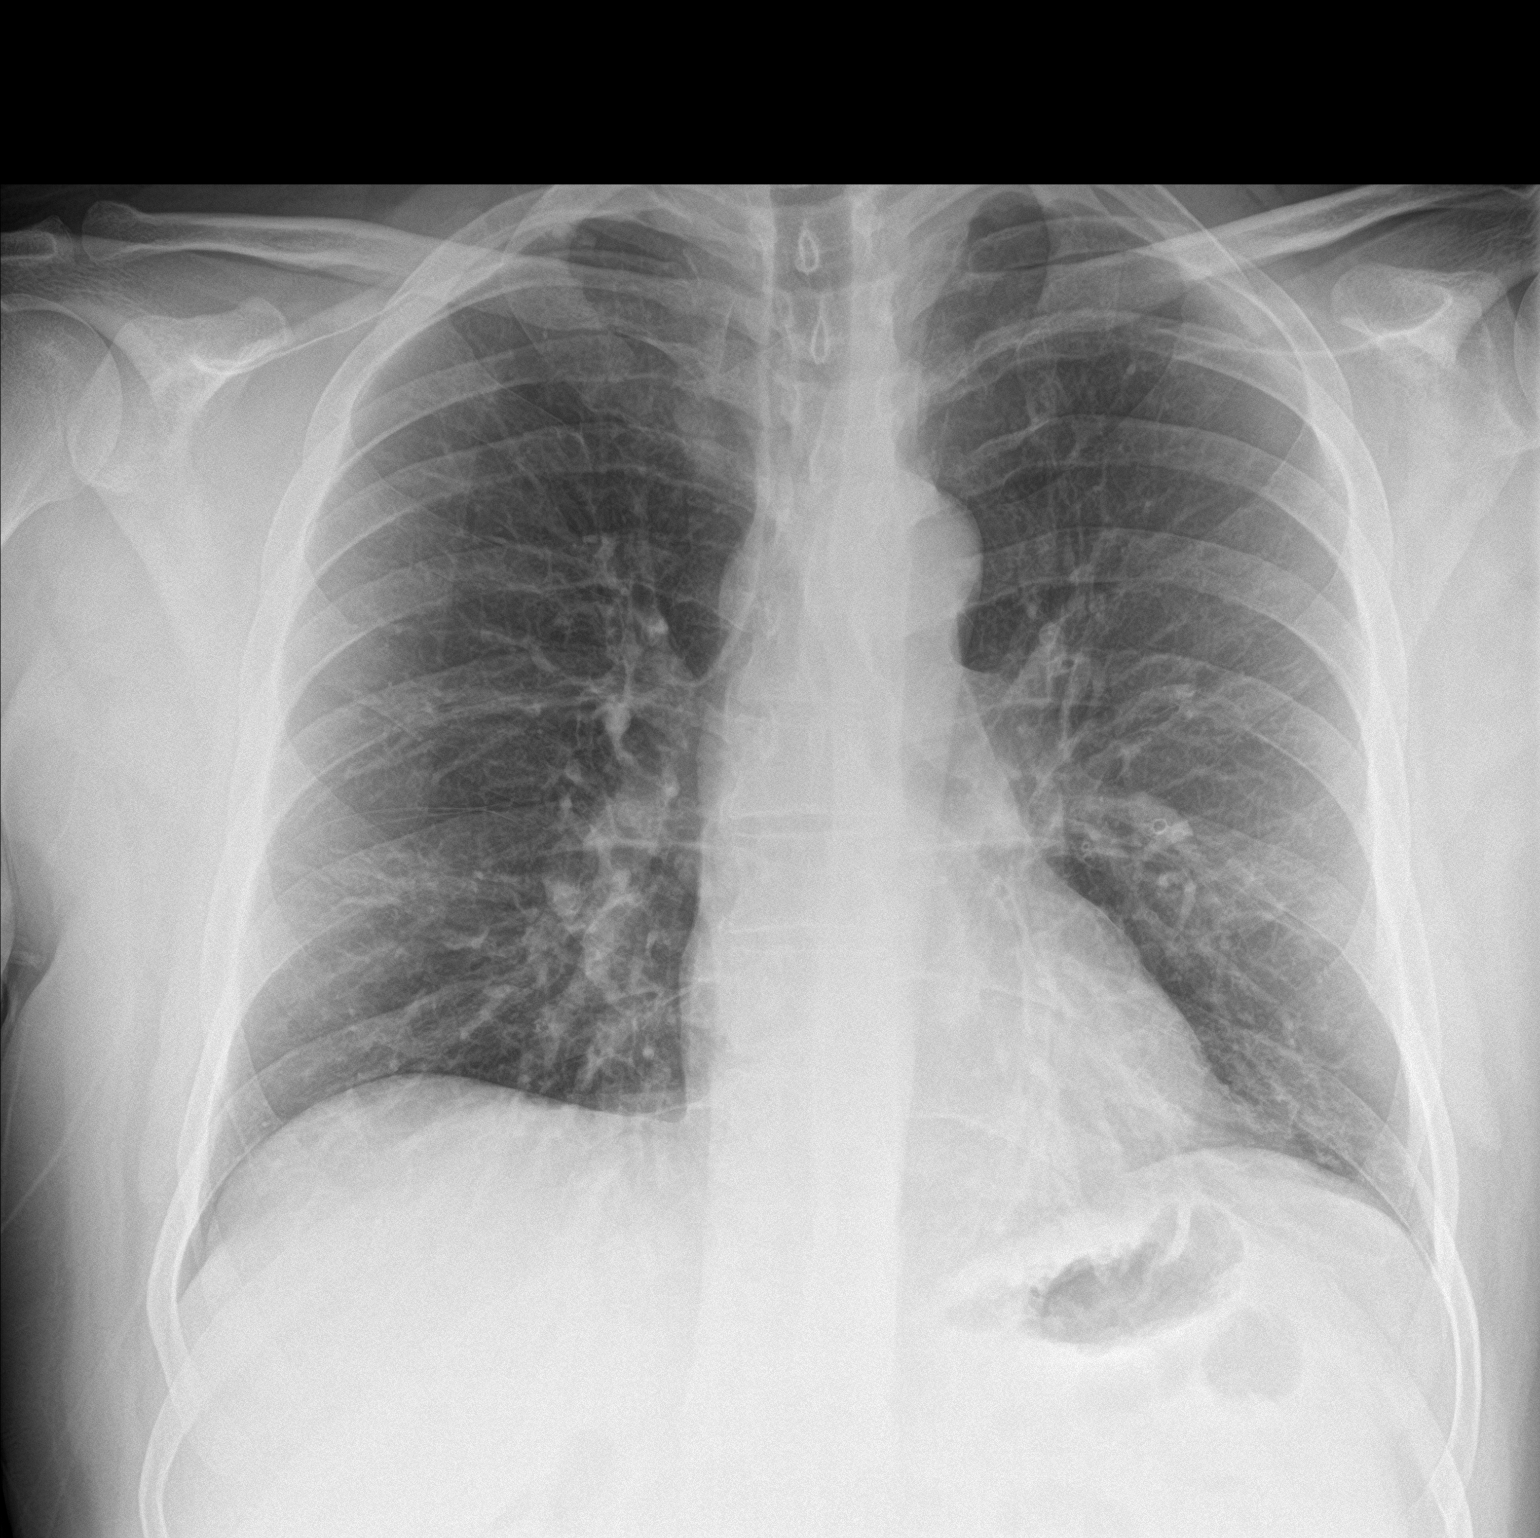

[chest lat]
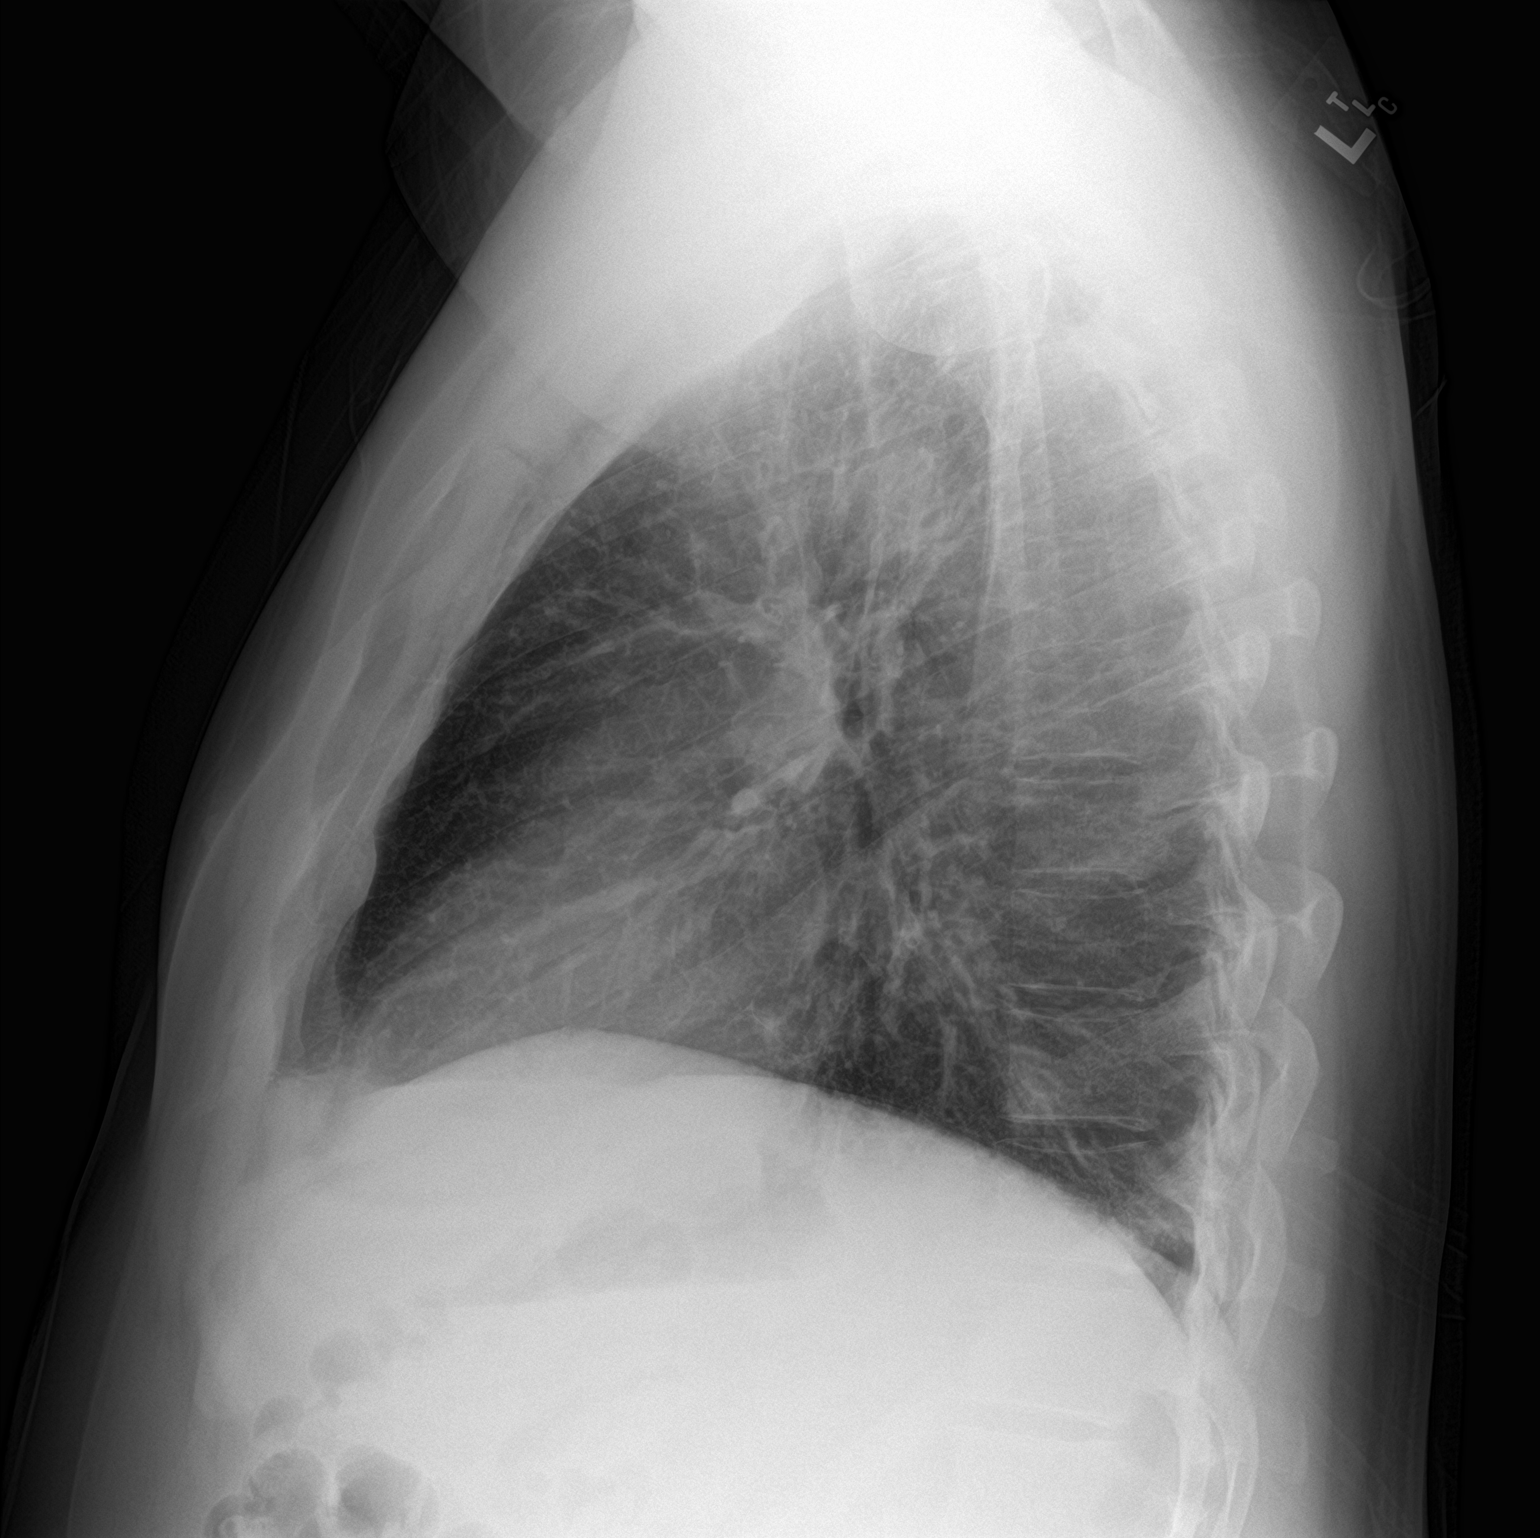

[2 of 2 positions shown; findings below may reference images not displayed]

FINDINGS: The heart size and mediastinal contours are normal. The lungs are
clear. There is no pleural effusion or pneumothorax. No acute
osseous findings are identified.
IMPRESSION: Stable chest.  No active cardiopulmonary process.

## 2024-05-24 ENCOUNTER — Other Ambulatory Visit: Payer: Self-pay | Admitting: Medical Genetics

## 2024-07-23 ENCOUNTER — Other Ambulatory Visit: Payer: Self-pay | Admitting: Medical Genetics

## 2024-07-23 DIAGNOSIS — Z006 Encounter for examination for normal comparison and control in clinical research program: Secondary | ICD-10-CM
